# Patient Record
Sex: Female | Born: 1995 | Race: White | Hispanic: No | Marital: Married | Smoking: Never smoker
Health system: Southern US, Community
[De-identification: ages and names within clinical notes are randomized; demographics above are authoritative.]

## PROBLEM LIST (undated history)

## (undated) DIAGNOSIS — K219 Gastro-esophageal reflux disease without esophagitis: Secondary | ICD-10-CM

## (undated) DIAGNOSIS — Z87442 Personal history of urinary calculi: Secondary | ICD-10-CM

## (undated) DIAGNOSIS — J189 Pneumonia, unspecified organism: Secondary | ICD-10-CM

## (undated) DIAGNOSIS — D649 Anemia, unspecified: Secondary | ICD-10-CM

## (undated) DIAGNOSIS — N83209 Unspecified ovarian cyst, unspecified side: Secondary | ICD-10-CM

## (undated) DIAGNOSIS — J45909 Unspecified asthma, uncomplicated: Secondary | ICD-10-CM

## (undated) DIAGNOSIS — T7840XA Allergy, unspecified, initial encounter: Secondary | ICD-10-CM

## (undated) DIAGNOSIS — N809 Endometriosis, unspecified: Secondary | ICD-10-CM

## (undated) DIAGNOSIS — F32A Depression, unspecified: Secondary | ICD-10-CM

## (undated) DIAGNOSIS — Z9889 Other specified postprocedural states: Secondary | ICD-10-CM

## (undated) DIAGNOSIS — R519 Headache, unspecified: Secondary | ICD-10-CM

## (undated) DIAGNOSIS — F419 Anxiety disorder, unspecified: Secondary | ICD-10-CM

## (undated) DIAGNOSIS — F4310 Post-traumatic stress disorder, unspecified: Secondary | ICD-10-CM

## (undated) HISTORY — DX: Allergy, unspecified, initial encounter: T78.40XA

## (undated) HISTORY — PX: NASAL SEPTUM SURGERY: SHX37

## (undated) HISTORY — DX: Unspecified asthma, uncomplicated: J45.909

## (undated) HISTORY — DX: Depression, unspecified: F32.A

## (undated) HISTORY — DX: Anxiety disorder, unspecified: F41.9

## (undated) HISTORY — PX: WISDOM TOOTH EXTRACTION: SHX21

## (undated) HISTORY — DX: Gastro-esophageal reflux disease without esophagitis: K21.9

---
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
## 2013-01-01 ENCOUNTER — Emergency Department (HOSPITAL_COMMUNITY)
Admission: EM | Admit: 2013-01-01 | Discharge: 2013-01-02 | Disposition: A | Discharge status: Home / Self Care | Payer: Federal, State, Local not specified - PPO | Attending: Emergency Medicine | Admitting: Emergency Medicine

## 2013-01-01 DIAGNOSIS — R109 Unspecified abdominal pain: Secondary | ICD-10-CM | POA: Insufficient documentation

## 2013-01-01 DIAGNOSIS — Z3202 Encounter for pregnancy test, result negative: Secondary | ICD-10-CM | POA: Insufficient documentation

## 2013-01-01 DIAGNOSIS — Z79899 Other long term (current) drug therapy: Secondary | ICD-10-CM | POA: Insufficient documentation

## 2013-01-01 DIAGNOSIS — R300 Dysuria: Secondary | ICD-10-CM | POA: Insufficient documentation

## 2013-01-01 DIAGNOSIS — R112 Nausea with vomiting, unspecified: Secondary | ICD-10-CM | POA: Insufficient documentation

## 2013-01-01 DIAGNOSIS — N12 Tubulo-interstitial nephritis, not specified as acute or chronic: Secondary | ICD-10-CM | POA: Insufficient documentation

## 2013-01-01 DIAGNOSIS — R6883 Chills (without fever): Secondary | ICD-10-CM | POA: Insufficient documentation

## 2013-01-02 ENCOUNTER — Emergency Department (HOSPITAL_COMMUNITY): Payer: Federal, State, Local not specified - PPO

## 2013-01-02 ENCOUNTER — Encounter (HOSPITAL_COMMUNITY): Payer: Self-pay | Admitting: *Deleted

## 2013-01-02 LAB — PREGNANCY, URINE: Preg Test, Ur: NEGATIVE

## 2013-01-02 LAB — URINALYSIS, ROUTINE W REFLEX MICROSCOPIC
Glucose, UA: NEGATIVE mg/dL
Ketones, ur: NEGATIVE mg/dL
Nitrite: POSITIVE — AB
pH: 6 (ref 5.0–8.0)

## 2013-01-02 LAB — URINE MICROSCOPIC-ADD ON

## 2013-01-02 MED ORDER — ONDANSETRON 4 MG PO TBDP
4.0000 mg | ORAL_TABLET | Freq: Once | ORAL | Status: AC
  Administered 2013-01-02: 4 mg via ORAL
  Filled 2013-01-02: qty 1

## 2013-01-02 MED ORDER — HYDROCODONE-ACETAMINOPHEN 5-325 MG PO TABS: 1.0000 | ORAL_TABLET | ORAL | Status: DC | PRN

## 2013-01-02 MED ORDER — OXYCODONE-ACETAMINOPHEN 5-325 MG PO TABS
2.0000 | ORAL_TABLET | Freq: Once | ORAL | Status: AC
  Administered 2013-01-02: 2 via ORAL
  Filled 2013-01-02: qty 2

## 2013-01-02 MED ORDER — ONDANSETRON 8 MG PO TBDP: ORAL_TABLET | ORAL | Status: DC

## 2013-01-02 MED ORDER — SULFAMETHOXAZOLE-TRIMETHOPRIM 800-160 MG PO TABS: 1.0000 | ORAL_TABLET | Freq: Two times a day (BID) | ORAL | Status: DC

## 2013-01-02 MED ORDER — CEFTRIAXONE SODIUM 1 G IJ SOLR
1.0000 g | Freq: Once | INTRAMUSCULAR | Status: AC
  Administered 2013-01-02: 1 g via INTRAMUSCULAR
  Filled 2013-01-02: qty 10

## 2013-01-02 NOTE — ED Provider Notes (Signed)
History     CSN: [id number]  Arrival date & time 01/01/13  2353   First MD Initiated Contact with Patient 01/02/13 0209      Chief Complaint  Patient presents with  . Urinary Frequency   HPI  History provided by the patient. Patient is a 17 year old female with no significant PMH who presents with complaints of dysuria and right flank pain for the past 4-5 days. Symptoms have been progressively worsening. They have been associated with some subjective chills, decreased appetite and occasional nausea and vomiting symptoms. Patient has still been able to tolerate some fluids. Pain has been becoming much worse and intolerable at home. She has been taking Aleve with only minimal improvements. She denies having any associated hematuria or urinary frequency. Denies any menstrual change. No vaginal bleeding or vaginal discharge. Denies any abdominal pain. No diarrhea or constipation. No other aggravating or alleviating factors. No other associated symptoms.    History reviewed. No pertinent past medical history.  History reviewed. No pertinent past surgical history.  No family history on file.  History  Substance Use Topics  . Smoking status: Never Smoker   . Smokeless tobacco: Not on file  . Alcohol Use: No    OB History   Grav Para Term Preterm Abortions TAB SAB Ect Mult Living                  Review of Systems  Constitutional: Positive for chills and appetite change.  Respiratory: Negative for cough.   Gastrointestinal: Positive for nausea and vomiting. Negative for diarrhea and constipation.  Genitourinary: Positive for dysuria and flank pain. Negative for frequency, hematuria, vaginal bleeding and vaginal discharge.  All other systems reviewed and are negative.    Allergies  Codeine and Nsaids  Home Medications   Current Outpatient Rx  Name  Route  Sig  Dispense  Refill  . albuterol (PROVENTIL HFA;VENTOLIN HFA) 108 (90 BASE) MCG/ACT inhaler   Inhalation   Inhale  2 puffs into the lungs every 6 (six) hours as needed for wheezing or shortness of breath.         . cholecalciferol (VITAMIN D) 1000 UNITS tablet   Oral   Take 1,000 Units by mouth daily.         Marland Kitchen loratadine (CLARITIN) 10 MG tablet   Oral   Take 10 mg by mouth daily.         . Multiple Vitamin (MULTIVITAMIN WITH MINERALS) TABS   Oral   Take 1 tablet by mouth daily.         Marland Kitchen omega-3 acid ethyl esters (LOVAZA) 1 G capsule   Oral   Take 1 g by mouth daily.           BP 137/93  Pulse 92  Temp(Src) 98.5 F (36.9 C) (Oral)  Resp 18  Ht 5\' 7"  (1.702 m)  Wt 200 lb (90.719 kg)  BMI 31.32 kg/m2  SpO2 98%  LMP 12/19/2012  Physical Exam  Nursing note and vitals reviewed. Constitutional: She is oriented to person, place, and time. She appears well-developed and well-nourished. No distress.  HENT:  Head: Normocephalic.  Cardiovascular: Normal rate and regular rhythm.   Pulmonary/Chest: Effort normal and breath sounds normal.  Abdominal: Soft. She exhibits no distension. There is no tenderness. There is no rebound and no guarding.  Right-sided CVA tenderness  Musculoskeletal: Normal range of motion.  Neurological: She is alert and oriented to person, place, and time.  Skin: Skin is warm  and dry. No rash noted.  Psychiatric: She has a normal mood and affect. Her behavior is normal.    ED Course  Procedures  Results for orders placed during the hospital encounter of 01/01/13  URINALYSIS, ROUTINE W REFLEX MICROSCOPIC      Result Value Range   Color, Urine AMBER (*) YELLOW   APPearance TURBID (*) CLEAR   Specific Gravity, Urine 1.015  1.005 - 1.030   pH 6.0  5.0 - 8.0   Glucose, UA NEGATIVE  NEGATIVE mg/dL   Hgb urine dipstick LARGE (*) NEGATIVE   Bilirubin Urine NEGATIVE  NEGATIVE   Ketones, ur NEGATIVE  NEGATIVE mg/dL   Protein, ur 454 (*) NEGATIVE mg/dL   Urobilinogen, UA 0.2  0.0 - 1.0 mg/dL   Nitrite POSITIVE (*) NEGATIVE   Leukocytes, UA LARGE (*)  NEGATIVE  PREGNANCY, URINE      Result Value Range   Preg Test, Ur NEGATIVE  NEGATIVE  URINE MICROSCOPIC-ADD ON      Result Value Range   WBC, UA TOO NUMEROUS TO COUNT  <3 WBC/hpf   RBC / HPF TOO NUMEROUS TO COUNT  <3 RBC/hpf   Urine-Other FIELD OBSCURED BY WBC'S           Ct Abdomen Pelvis Wo Contrast  01/02/2013  *RADIOLOGY REPORT*  Clinical Data: Low back pain.  Urinary frequency.  CT ABDOMEN AND PELVIS WITHOUT CONTRAST  Technique:  Multidetector CT imaging of the abdomen and pelvis was performed following the standard protocol without intravenous contrast.  Comparison: None.  Findings: Lung Bases: Negative.  Liver:  Unenhanced CT was performed per clinician order.  Lack of IV contrast limits sensitivity and specificity, especially for evaluation of abdominal/pelvic solid viscera.  Liver grossly normal.  Spleen:  Normal.  Gallbladder:  No calcified stones.  Normal.  Common bile duct:  Normal.  Pancreas:  Normal.  Adrenal glands:  Normal.  Kidneys:  No collecting system calculi in the kidneys. Mild left ureteral ectasia.  There is mild perinephric stranding around the right kidney.  No calculi are present.  Periureteric stranding is present around the right ureter.  There is no ureteral calculus identified.  Findings suggestive of ascending urinary tract and fraction was with the right perinephric stranding likely due to pyelonephritis.  Stomach:  Grossly normal.  Small bowel:  Grossly normal.  Colon:   Grossly normal.  Pelvic Genitourinary:  Physiologic appearance of the uterus and adnexa.  Urinary bladder normal.  Bones:  Normal.  Vasculature: Normal for noncontrast technique.  IMPRESSION: Right perinephric and periureteric stranding suggesting ascending urinary tract infection and pyelonephritis.  No renal calculi. Recent passage of stone could simulate these findings.   Original Report Authenticated By: Andreas Newport, M.D.      1. Pyelonephritis       MDM  Patient seen and evaluated.  Patient appears well in no acute distress. She is not appears fairly ill or toxic. Unremarkable bowel signs. Patient tolerating by mouth fluids.  Patient feeling better after pain medications. She continues to be able tolerate by mouth fluids well. CT scan is not show signs of kidney stone at this time. Urine and CT of consistent with pyelonephritis. We'll give dose of Rocephin now as well as prescription for Bactrim.       Angus Seller, PA-C 01/02/13 0430

## 2013-01-02 NOTE — ED Notes (Signed)
Pt c/o urinary frequency x 2 days; pink tinged urine; lower back pain

## 2013-01-02 NOTE — ED Provider Notes (Signed)
Medical screening examination/treatment/procedure(s) were performed by non-physician practitioner and as supervising physician I was immediately available for consultation/collaboration.  Sunnie Nielsen, MD 01/02/13 [phone]

## 2013-01-04 LAB — URINE CULTURE

## 2013-01-05 ENCOUNTER — Telehealth (HOSPITAL_COMMUNITY): Payer: Self-pay | Admitting: Emergency Medicine

## 2013-01-05 NOTE — ED Notes (Signed)
+  Urine. Patient treated with Septra. Sensitive to same. Per protocol MD. °

## 2013-01-05 NOTE — ED Notes (Signed)
Patient has +Urine culture. Checking to see if treated appropriately. °

## 2014-01-12 ENCOUNTER — Emergency Department (HOSPITAL_COMMUNITY)
Admission: EM | Admit: 2014-01-12 | Discharge: 2014-01-12 | Disposition: A | Discharge status: Home / Self Care | Payer: Federal, State, Local not specified - PPO | Attending: Emergency Medicine | Admitting: Emergency Medicine

## 2014-01-12 ENCOUNTER — Encounter (HOSPITAL_COMMUNITY): Payer: Self-pay | Admitting: Emergency Medicine

## 2014-01-12 DIAGNOSIS — S298XXA Other specified injuries of thorax, initial encounter: Secondary | ICD-10-CM | POA: Insufficient documentation

## 2014-01-12 DIAGNOSIS — M542 Cervicalgia: Secondary | ICD-10-CM

## 2014-01-12 DIAGNOSIS — S0993XA Unspecified injury of face, initial encounter: Secondary | ICD-10-CM | POA: Insufficient documentation

## 2014-01-12 DIAGNOSIS — R0602 Shortness of breath: Secondary | ICD-10-CM | POA: Insufficient documentation

## 2014-01-12 DIAGNOSIS — S199XXA Unspecified injury of neck, initial encounter: Principal | ICD-10-CM

## 2014-01-12 DIAGNOSIS — S0990XA Unspecified injury of head, initial encounter: Secondary | ICD-10-CM | POA: Insufficient documentation

## 2014-01-12 DIAGNOSIS — Z79899 Other long term (current) drug therapy: Secondary | ICD-10-CM | POA: Insufficient documentation

## 2014-01-12 DIAGNOSIS — Z792 Long term (current) use of antibiotics: Secondary | ICD-10-CM | POA: Insufficient documentation

## 2014-01-12 DIAGNOSIS — Y9389 Activity, other specified: Secondary | ICD-10-CM | POA: Insufficient documentation

## 2014-01-12 DIAGNOSIS — Y92410 Unspecified street and highway as the place of occurrence of the external cause: Secondary | ICD-10-CM | POA: Insufficient documentation

## 2014-01-12 DIAGNOSIS — G44209 Tension-type headache, unspecified, not intractable: Secondary | ICD-10-CM | POA: Insufficient documentation

## 2014-01-12 MED ORDER — CYCLOBENZAPRINE HCL 10 MG PO TABS: 10.0000 mg | ORAL_TABLET | Freq: Two times a day (BID) | ORAL | Status: DC | PRN

## 2014-01-12 MED ORDER — HYDROCODONE-ACETAMINOPHEN 5-325 MG PO TABS: 1.0000 | ORAL_TABLET | ORAL | Status: DC | PRN
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
## 2014-01-12 NOTE — ED Notes (Signed)
Discharge instructions given to patient. Voiced understanding
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
## 2014-01-13 NOTE — ED Provider Notes (Signed)
Medical screening examination/treatment/procedure(s) were performed by non-physician practitioner and as supervising physician I was immediately available for consultation/collaboration.   Megan E Docherty, MD 01/13/14 1053 

## 2014-05-16 ENCOUNTER — Encounter: Payer: Self-pay | Admitting: Gynecology

## 2014-05-16 ENCOUNTER — Ambulatory Visit (INDEPENDENT_AMBULATORY_CARE_PROVIDER_SITE_OTHER): Payer: Federal, State, Local not specified - PPO | Admitting: Gynecology

## 2014-05-16 VITALS — BP 134/86 | Ht 65.0 in | Wt 267.4 lb

## 2014-05-16 DIAGNOSIS — Z113 Encounter for screening for infections with a predominantly sexual mode of transmission: Secondary | ICD-10-CM

## 2014-05-16 DIAGNOSIS — F4310 Post-traumatic stress disorder, unspecified: Secondary | ICD-10-CM | POA: Insufficient documentation

## 2014-05-16 DIAGNOSIS — Z01419 Encounter for gynecological examination (general) (routine) without abnormal findings: Secondary | ICD-10-CM

## 2014-05-16 LAB — CBC WITH DIFFERENTIAL/PLATELET
BASOS ABS: 0 10*3/uL (ref 0.0–0.1)
Basophils Relative: 0 % (ref 0–1)
EOS PCT: 4 % (ref 0–5)
Eosinophils Absolute: 0.3 10*3/uL (ref 0.0–0.7)
HEMATOCRIT: 40.6 % (ref 36.0–46.0)
HEMOGLOBIN: 13.8 g/dL (ref 12.0–15.0)
LYMPHS ABS: 2.5 10*3/uL (ref 0.7–4.0)
LYMPHS PCT: 32 % (ref 12–46)
MCH: 29.2 pg (ref 26.0–34.0)
MCHC: 34 g/dL (ref 30.0–36.0)
MCV: 85.8 fL (ref 78.0–100.0)
MONO ABS: 0.7 10*3/uL (ref 0.1–1.0)
MONOS PCT: 9 % (ref 3–12)
Neutro Abs: 4.3 10*3/uL (ref 1.7–7.7)
Neutrophils Relative %: 55 % (ref 43–77)
Platelets: 269 10*3/uL (ref 150–400)
RBC: 4.73 MIL/uL (ref 3.87–5.11)
RDW: 13.8 % (ref 11.5–15.5)
WBC: 7.8 10*3/uL (ref 4.0–10.5)

## 2014-05-16 NOTE — Patient Instructions (Signed)
Levonorgestrel intrauterine device (IUD) What is this medicine? LEVONORGESTREL IUD (LEE voe nor jes trel) is a contraceptive (birth control) device. The device is placed inside the uterus by a healthcare professional. It is used to prevent pregnancy and can also be used to treat heavy bleeding that occurs during your period. Depending on the device, it can be used for 3 to 5 years. This medicine may be used for other purposes; ask your health care provider or pharmacist if you have questions. COMMON BRAND NAME(S): LILETTA, Mirena, Skyla What should I tell my health care provider before I take this medicine? They need to know if you have any of these conditions: -abnormal Pap smear -cancer of the breast, uterus, or cervix -diabetes -endometritis -genital or pelvic infection now or in the past -have more than one sexual partner or your partner has more than one partner -heart disease -history of an ectopic or tubal pregnancy -immune system problems -IUD in place -liver disease or tumor -problems with blood clots or take blood-thinners -use intravenous drugs -uterus of unusual shape -vaginal bleeding that has not been explained -an unusual or allergic reaction to levonorgestrel, other hormones, silicone, or polyethylene, medicines, foods, dyes, or preservatives -pregnant or trying to get pregnant -breast-feeding How should I use this medicine? This device is placed inside the uterus by a health care professional. Talk to your pediatrician regarding the use of this medicine in children. Special care may be needed. Overdosage: If you think you have taken too much of this medicine contact a poison control center or emergency room at once. NOTE: This medicine is only for you. Do not share this medicine with others. What if I miss a dose? This does not apply. What may interact with this medicine? Do not take this medicine with any of the following  medications: -amprenavir -bosentan -fosamprenavir This medicine may also interact with the following medications: -aprepitant -barbiturate medicines for inducing sleep or treating seizures -bexarotene -griseofulvin -medicines to treat seizures like carbamazepine, ethotoin, felbamate, oxcarbazepine, phenytoin, topiramate -modafinil -pioglitazone -rifabutin -rifampin -rifapentine -some medicines to treat HIV infection like atazanavir, indinavir, lopinavir, nelfinavir, tipranavir, ritonavir -St. John's wort -warfarin This list may not describe all possible interactions. Give your health care provider a list of all the medicines, herbs, non-prescription drugs, or dietary supplements you use. Also tell them if you smoke, drink alcohol, or use illegal drugs. Some items may interact with your medicine. What should I watch for while using this medicine? Visit your doctor or health care professional for regular check ups. See your doctor if you or your partner has sexual contact with others, becomes HIV positive, or gets a sexual transmitted disease. This product does not protect you against HIV infection (AIDS) or other sexually transmitted diseases. You can check the placement of the IUD yourself by reaching up to the top of your vagina with clean fingers to feel the threads. Do not pull on the threads. It is a good habit to check placement after each menstrual period. Call your doctor right away if you feel more of the IUD than just the threads or if you cannot feel the threads at all. The IUD may come out by itself. You may become pregnant if the device comes out. If you notice that the IUD has come out use a backup birth control method like condoms and call your health care provider. Using tampons will not change the position of the IUD and are okay to use during your period. What side effects may   I notice from receiving this medicine? Side effects that you should report to your doctor or  health care professional as soon as possible: -allergic reactions like skin rash, itching or hives, swelling of the face, lips, or tongue -fever, flu-like symptoms -genital sores -high blood pressure -no menstrual period for 6 weeks during use -pain, swelling, warmth in the leg -pelvic pain or tenderness -severe or sudden headache -signs of pregnancy -stomach cramping -sudden shortness of breath -trouble with balance, talking, or walking -unusual vaginal bleeding, discharge -yellowing of the eyes or skin Side effects that usually do not require medical attention (report to your doctor or health care professional if they continue or are bothersome): -acne -breast pain -change in sex drive or performance -changes in weight -cramping, dizziness, or faintness while the device is being inserted -headache -irregular menstrual bleeding within first 3 to 6 months of use -nausea This list may not describe all possible side effects. Call your doctor for medical advice about side effects. You may report side effects to FDA at 1-800-FDA-1088. Where should I keep my medicine? This does not apply. NOTE: This sheet is a summary. It may not cover all possible information. If you have questions about this medicine, talk to your doctor, pharmacist, or health care provider.  2015, Elsevier/Gold Standard. (2011-10-28 13:54:04)  
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
## 2014-05-17 LAB — COMPREHENSIVE METABOLIC PANEL
ALBUMIN: 4.4 g/dL (ref 3.5–5.2)
ALT: 17 U/L (ref 0–35)
AST: 18 U/L (ref 0–37)
Alkaline Phosphatase: 65 U/L (ref 39–117)
BUN: 15 mg/dL (ref 6–23)
CALCIUM: 9.7 mg/dL (ref 8.4–10.5)
CHLORIDE: 103 meq/L (ref 96–112)
CO2: 25 meq/L (ref 19–32)
CREATININE: 0.85 mg/dL (ref 0.50–1.10)
GLUCOSE: 81 mg/dL (ref 70–99)
Potassium: 4.1 mEq/L (ref 3.5–5.3)
Sodium: 138 mEq/L (ref 135–145)
Total Bilirubin: 0.3 mg/dL (ref 0.2–1.2)
Total Protein: 7.1 g/dL (ref 6.0–8.3)

## 2014-05-17 LAB — URINALYSIS W MICROSCOPIC + REFLEX CULTURE
Bacteria, UA: NONE SEEN
Bilirubin Urine: NEGATIVE
Casts: NONE SEEN
Crystals: NONE SEEN
Glucose, UA: NEGATIVE mg/dL
HGB URINE DIPSTICK: NEGATIVE
Ketones, ur: NEGATIVE mg/dL
LEUKOCYTES UA: NEGATIVE
Nitrite: NEGATIVE
PROTEIN: NEGATIVE mg/dL
Specific Gravity, Urine: 1.018 (ref 1.005–1.030)
UROBILINOGEN UA: 0.2 mg/dL (ref 0.0–1.0)
pH: 5 (ref 5.0–8.0)

## 2014-05-17 LAB — TSH: TSH: 2.632 u[IU]/mL (ref 0.350–4.500)

## 2014-05-17 LAB — GC/CHLAMYDIA PROBE AMP
CT PROBE, AMP APTIMA: NEGATIVE
GC PROBE AMP APTIMA: NEGATIVE

## 2014-05-17 LAB — CHOLESTEROL, TOTAL: CHOLESTEROL: 158 mg/dL (ref 0–200)

## 2014-05-20 ENCOUNTER — Other Ambulatory Visit: Payer: Self-pay | Admitting: Gynecology

## 2014-05-20 ENCOUNTER — Telehealth: Payer: Self-pay | Admitting: Gynecology

## 2014-05-20 DIAGNOSIS — Z3049 Encounter for surveillance of other contraceptives: Secondary | ICD-10-CM

## 2014-05-20 MED ORDER — LEVONORGESTREL 20 MCG/24HR IU IUD: INTRAUTERINE_SYSTEM | Freq: Once | INTRAUTERINE | Status: DC

## 2014-05-20 NOTE — Telephone Encounter (Signed)
05/20/14-Pt was advised today that the Mirena & insertion will be covered at 100% [facility] She will call first day next cycle for Insertion with JF/wl

## 2014-11-19 ENCOUNTER — Telehealth: Payer: Self-pay | Admitting: Gynecology

## 2014-11-19 ENCOUNTER — Other Ambulatory Visit: Payer: Self-pay | Admitting: Gynecology

## 2014-11-19 DIAGNOSIS — Z30431 Encounter for routine checking of intrauterine contraceptive device: Secondary | ICD-10-CM

## 2014-11-19 MED ORDER — LEVONORGESTREL 20 MCG/24HR IU IUD: INTRAUTERINE_SYSTEM | Freq: Once | INTRAUTERINE | Status: DC

## 2014-11-19 NOTE — Telephone Encounter (Signed)
11/19/14-I rechecked pt Mirena benefits as she did not get inserted in 2015 and [facility] it is still covered at 100%, no copay, for contraception. She will call to schedule with JF. This is a calendar year benefit.wl--BC [phone]#[id number]

## 2014-11-21 ENCOUNTER — Ambulatory Visit (INDEPENDENT_AMBULATORY_CARE_PROVIDER_SITE_OTHER): Payer: Federal, State, Local not specified - PPO | Admitting: Gynecology

## 2014-11-21 ENCOUNTER — Encounter: Payer: Self-pay | Admitting: Gynecology

## 2014-11-21 VITALS — BP 128/86

## 2014-11-21 DIAGNOSIS — Z975 Presence of (intrauterine) contraceptive device: Secondary | ICD-10-CM | POA: Insufficient documentation

## 2014-11-21 DIAGNOSIS — Z30430 Encounter for insertion of intrauterine contraceptive device: Secondary | ICD-10-CM

## 2014-11-21 NOTE — Patient Instructions (Signed)

## 2014-11-21 NOTE — Progress Notes (Signed)
   Patient is a 19 year old who presented to the office today for placement of Mirena IUD. Patient had been on the Depo-Provera and her last dose was in September 2015. Patient has had normal menstrual cycles currently menstruating.                                                                    IUD procedure note       Patient presented to the office today for placement of Mirena IUD. The patient had previously been provided with literature information on this method of contraception. The risks benefits and pros and cons were discussed and all her questions were answered. She is fully aware that this form of contraception is 99% effective and is good for 5 years.  Pelvic exam: Bartholin urethra Skene glands: Within normal limits Vagina: No lesions or discharge Cervix: No lesions or discharge Uterus: Anteverted position Adnexa: No masses or tenderness Rectal exam: Not done  The cervix was cleansed with Betadine solution. A single-tooth tenaculum was placed on the anterior cervical lip. The uterus sounded to 7-1/2 centimeter. The IUD was shown to the patient and inserted in a sterile fashion. The IUD string was trimmed. The single-tooth tenaculum was removed. Patient was instructed to return back to the office in one month for follow up.       Mirena IUD placed 11/21/2014 good for 5 years. Lot number [id number]

## 2014-11-22 ENCOUNTER — Encounter: Payer: Self-pay | Admitting: Gynecology

## 2014-12-19 ENCOUNTER — Encounter: Payer: Self-pay | Admitting: Gynecology

## 2014-12-19 ENCOUNTER — Ambulatory Visit (INDEPENDENT_AMBULATORY_CARE_PROVIDER_SITE_OTHER): Payer: Federal, State, Local not specified - PPO | Admitting: Gynecology

## 2014-12-19 ENCOUNTER — Ambulatory Visit (INDEPENDENT_AMBULATORY_CARE_PROVIDER_SITE_OTHER): Payer: Federal, State, Local not specified - PPO

## 2014-12-19 ENCOUNTER — Other Ambulatory Visit: Payer: Self-pay | Admitting: Gynecology

## 2014-12-19 VITALS — BP 120/76

## 2014-12-19 DIAGNOSIS — R102 Pelvic and perineal pain: Secondary | ICD-10-CM

## 2014-12-19 DIAGNOSIS — Z30431 Encounter for routine checking of intrauterine contraceptive device: Secondary | ICD-10-CM

## 2014-12-19 DIAGNOSIS — N8320 Unspecified ovarian cysts: Secondary | ICD-10-CM | POA: Diagnosis not present

## 2014-12-19 DIAGNOSIS — N921 Excessive and frequent menstruation with irregular cycle: Secondary | ICD-10-CM

## 2014-12-19 DIAGNOSIS — N857 Hematometra: Secondary | ICD-10-CM | POA: Diagnosis not present

## 2014-12-19 DIAGNOSIS — Z975 Presence of (intrauterine) contraceptive device: Secondary | ICD-10-CM

## 2014-12-19 DIAGNOSIS — N83201 Unspecified ovarian cyst, right side: Secondary | ICD-10-CM

## 2014-12-19 LAB — PREGNANCY, URINE: PREG TEST UR: NEGATIVE

## 2014-12-19 MED ORDER — DOXYCYCLINE HYCLATE 100 MG PO CAPS: 100.0000 mg | ORAL_CAPSULE | Freq: Two times a day (BID) | ORAL | Status: DC

## 2014-12-19 NOTE — Progress Notes (Signed)
   Patient is a 19 year old who presented to the office today for her 1 month follow-up after having placed the Mirena IUD. She was seen for her annual exam back in August 6 and she had been on Depo-Provera but had to discontinue because of irregular bleeding. We'll place a Mirena IUD she had been off the Depo-Provera for 2 months. Patient denies any GU or GI complaints. Patient denies any back pain. Patient denies any fever, chills, nausea, or vomiting. She stated she spotted the first few months after the Mirena IUD was inserted and for the past 2 weeks she has been bleeding along with right lower abdominal discomfort.  Exam: Blood pressure 120/76 weight 267 pounds BMI 44.50 kg/m   55 feet 5 inches tall   Gen. appearanc: Well-developed well-nourished overweight Caucasian female with complaint of right lower abdominal discomfort and irregular bleeding with Mirena IUD placed one month ago. Back: No CVA tenderness Abdomen: Pendulous, soft nontender no rebound or guarding Pelvic: Bartholin urethra Skene was within normal limits Vagina: No blood was present the vaginal vault Cervix: No active bleeding no lesions seen IUD string not visualized Bimanual exam anteverted uterus some tenderness elicited the right adnexa but due to patient's abdominal girth it was difficult to assess and for this reason an ultrasound will be ordered later today as well as to confirm position of the IUD.  Ultrasound: Uterus measures 7.9 x 4.8 x 4.0 cm with endometrial stripe of 15.7 mm. The IUD was seen in the proper position. Endometrium was prominent with a hypoechoic avascular focus measuring 20 x 11 mm. Right ovary thinwall cyst with a thin septum measuring 16 x 19 mm echogenic focus 3 mm was noted avascular. Left R was normal no fluid in the cul-de-sac.     Assessment/plan: Patient one month status post placement of Mirena IUD with dysfunctional uterine bleeding patient was similar situation when she was the  Depo-Provera injection. She is going to be prescribed Vibramycin 100 mg twice a day for 7 days in the event a mild endometritis. She will return back to the office in 3 months for follow-up ultrasound for the small ovarian cysts.

## 2014-12-19 NOTE — Patient Instructions (Signed)
Ovarian Cyst An ovarian cyst is a fluid-filled sac that forms on an ovary. The ovaries are small organs that produce eggs in women. Various types of cysts can form on the ovaries. Most are not cancerous. Many do not cause problems, and they often go away on their own. Some may cause symptoms and require treatment. Common types of ovarian cysts include:  Functional cysts--These cysts may occur every month during the menstrual cycle. This is normal. The cysts usually go away with the next menstrual cycle if the woman does not get pregnant. Usually, there are no symptoms with a functional cyst.  Endometrioma cysts--These cysts form from the tissue that lines the uterus. They are also called "chocolate cysts" because they become filled with blood that turns brown. This type of cyst can cause pain in the lower abdomen during intercourse and with your menstrual period.  Cystadenoma cysts--This type develops from the cells on the outside of the ovary. These cysts can get very big and cause lower abdomen pain and pain with intercourse. This type of cyst can twist on itself, cut off its blood supply, and cause severe pain. It can also easily rupture and cause a lot of pain.  Dermoid cysts--This type of cyst is sometimes found in both ovaries. These cysts may contain different kinds of body tissue, such as skin, teeth, hair, or cartilage. They usually do not cause symptoms unless they get very big.  Theca lutein cysts--These cysts occur when too much of a certain hormone (human chorionic gonadotropin) is produced and overstimulates the ovaries to produce an egg. This is most common after procedures used to assist with the conception of a baby (in vitro fertilization). CAUSES   Fertility drugs can cause a condition in which multiple large cysts are formed on the ovaries. This is called ovarian hyperstimulation syndrome.  A condition called polycystic ovary syndrome can cause hormonal imbalances that can lead to  nonfunctional ovarian cysts. SIGNS AND SYMPTOMS  Many ovarian cysts do not cause symptoms. If symptoms are present, they may include:  Pelvic pain or pressure.  Pain in the lower abdomen.  Pain during sexual intercourse.  Increasing girth (swelling) of the abdomen.  Abnormal menstrual periods.  Increasing pain with menstrual periods.  Stopping having menstrual periods without being pregnant. DIAGNOSIS  These cysts are commonly found during a routine or annual pelvic exam. Tests may be ordered to find out more about the cyst. These tests may include:  Ultrasound.  X-ray of the pelvis.  CT scan.  MRI.  Blood tests. TREATMENT  Many ovarian cysts go away on their own without treatment. Your health care provider may want to check your cyst regularly for 2-3 months to see if it changes. For women in menopause, it is particularly important to monitor a cyst closely because of the higher rate of ovarian cancer in menopausal women. When treatment is needed, it may include any of the following:  A procedure to drain the cyst (aspiration). This may be done using a long needle and ultrasound. It can also be done through a laparoscopic procedure. This involves using a thin, lighted tube with a tiny camera on the end (laparoscope) inserted through a small incision.  Surgery to remove the whole cyst. This may be done using laparoscopic surgery or an open surgery involving a larger incision in the lower abdomen.  Hormone treatment or birth control pills. These methods are sometimes used to help dissolve a cyst. HOME CARE INSTRUCTIONS   Only take over-the-counter   or prescription medicines as directed by your health care provider.  Follow up with your health care provider as directed.  Get regular pelvic exams and Pap tests. SEEK MEDICAL CARE IF:   Your periods are late, irregular, or painful, or they stop.  Your pelvic pain or abdominal pain does not go away.  Your abdomen becomes  larger or swollen.  You have pressure on your bladder or trouble emptying your bladder completely.  You have pain during sexual intercourse.  You have feelings of fullness, pressure, or discomfort in your stomach.  You lose weight for no apparent reason.  You feel generally ill.  You become constipated.  You lose your appetite.  You develop acne.  You have an increase in body and facial hair.  You are gaining weight, without changing your exercise and eating habits.  You think you are pregnant. SEEK IMMEDIATE MEDICAL CARE IF:   You have increasing abdominal pain.  You feel sick to your stomach (nauseous), and you throw up (vomit).  You develop a fever that comes on suddenly.  You have abdominal pain during a bowel movement.  Your menstrual periods become heavier than usual. MAKE SURE YOU:  Understand these instructions.  Will watch your condition.  Will get help right away if you are not doing well or get worse. Document Released: 09/27/2005 Document Revised: 10/02/2013 Document Reviewed: 06/04/2013 ExitCare Patient Information 2015 [employer]. This information is not intended to replace advice given to you by your health care provider. Make sure you discuss any questions you have with your health care provider.  

## 2015-01-20 LAB — HM HIV SCREENING LAB: HM HIV Screening: NEGATIVE

## 2015-01-20 LAB — HM HEPATITIS C SCREENING LAB: HM Hepatitis Screen: NEGATIVE

## 2015-03-21 ENCOUNTER — Ambulatory Visit (INDEPENDENT_AMBULATORY_CARE_PROVIDER_SITE_OTHER): Payer: Federal, State, Local not specified - PPO | Admitting: Gynecology

## 2015-03-21 ENCOUNTER — Ambulatory Visit (INDEPENDENT_AMBULATORY_CARE_PROVIDER_SITE_OTHER): Payer: Federal, State, Local not specified - PPO

## 2015-03-21 ENCOUNTER — Other Ambulatory Visit: Payer: Self-pay | Admitting: Gynecology

## 2015-03-21 ENCOUNTER — Encounter: Payer: Self-pay | Admitting: Gynecology

## 2015-03-21 VITALS — BP 126/80 | Ht 65.0 in | Wt 267.0 lb

## 2015-03-21 DIAGNOSIS — T8332XA Displacement of intrauterine contraceptive device, initial encounter: Secondary | ICD-10-CM

## 2015-03-21 DIAGNOSIS — N921 Excessive and frequent menstruation with irregular cycle: Secondary | ICD-10-CM

## 2015-03-21 DIAGNOSIS — N8320 Unspecified ovarian cysts: Secondary | ICD-10-CM

## 2015-03-21 DIAGNOSIS — Z975 Presence of (intrauterine) contraceptive device: Secondary | ICD-10-CM

## 2015-03-21 DIAGNOSIS — N83202 Unspecified ovarian cyst, left side: Secondary | ICD-10-CM

## 2015-03-21 DIAGNOSIS — N83201 Unspecified ovarian cyst, right side: Secondary | ICD-10-CM

## 2015-03-21 DIAGNOSIS — T8389XA Other specified complication of genitourinary prosthetic devices, implants and grafts, initial encounter: Secondary | ICD-10-CM

## 2015-03-21 DIAGNOSIS — N938 Other specified abnormal uterine and vaginal bleeding: Secondary | ICD-10-CM | POA: Diagnosis not present

## 2015-03-21 MED ORDER — TRANEXAMIC ACID 650 MG PO TABS: 1300.0000 mg | ORAL_TABLET | Freq: Three times a day (TID) | ORAL | Status: DC

## 2015-03-21 NOTE — Addendum Note (Signed)
Addended by: Ok Edwards on: 03/21/2015 10:03 AM   Modules accepted: Orders

## 2015-03-21 NOTE — Progress Notes (Signed)
   Patient presented to the office today because of a persistent dysfunctional uterine bleeding after having had the Mirena IUD placed. Patient had been seen the office on February 2016 after having placed the Mirena IUD. Patient to passive been on Depo-Provera and had to discontinue because continuing irregular bleeding. An ultrasound done on that office visit on 12/19/2014 demonstrated the following:  Uterus measures 7.9 x 4.8 x 4.0 cm with endometrial stripe of 15.7 mm. The IUD was seen in the proper position. Endometrium was prominent with a hypoechoic avascular focus measuring 20 x 11 mm. Right ovary thinwall cyst with a thin septum measuring 16 x 19 mm echogenic focus 3 mm was noted avascular. Left R was normal no fluid in the cul-de-sac.  She was prescribed Vibramycin 100 mg twice a day for 7 days in the event that it was a possible endometritis but she has continued to bleed and is here for follow-up ultrasound.  Ultrasound today: Uterus measures 7.9 x 4.8 x 4.0 cm within a major stripe of 8.9 mm. IUD was seen in the lower uterine segment above the cervix. Small calcified area of left ovary measuring 3 mm was noted. A thinwall echo-free follicle measuring 23 x 20 x 22 mm avascular was noted on the left. The small cyst with small septum noted on the left ovary on previous scan not present. There was no fluid in the cul-de-sac.  Patient was presented with above findings it was recommended to remove the IUD to relieve her symptoms of cramping and irregular bleeding since the IUD was sitting in the lower uterine segment year the cervix. The cervix was cleansed with Betadine solution and a [place] clamp was used to grasp the IUD string and was retrieved shown to the patient and discarded.  Assessment/plan: Patient dysfunctional bleeding as a result of migration of the IUD to the lower uterine segment near the top of the internal cervical os was removed. Previously seen left ovarian cyst with septum  resolved. Patient will be prescribed Lysteda 650 mg 2 tablets 3 times a day for 5 days during her menses she'll use barrier contraception for the next 3 months. She will return back to the office in 3 months for placement of the Mirena IUD under ultrasound guidance. Meanwhile she will use barrier contraception.

## 2015-03-21 NOTE — Patient Instructions (Signed)
Tranexamic acid oral tablets What is this medicine? TRANEXAMIC ACID (TRAN ex AM ik AS id) slows down or stops blood clots from being broken down. This medicine is used to treat heavy monthly menstrual bleeding. This medicine may be used for other purposes; ask your health care provider or pharmacist if you have questions. COMMON BRAND NAME(S): Cyklokapron, Lysteda What should I tell my health care provider before I take this medicine? They need to know if you have any of these conditions: -bleeding in the brain -blood clotting problems -kidney disease -vision problems -an unusual allergic reaction to tranexamic acid, other medicines, foods, dyes, or preservatives -pregnant or trying to get pregnant -breast-feeding How should I use this medicine? Take this medicine by mouth with a glass of water. Follow the directions on the prescription label. Do not cut, crush, or chew this medicine. You can take it with or without food. If it upsets your stomach, take it with food. Take your medicine at regular intervals. Do not take it more often than directed. Do not stop taking except on your doctor's advice. Do not take this medicine until your period has started. Do not take it for more than 5 days in a row. Do not take this medicine when you do not have your period. Talk to your pediatrician regarding the use of this medicine in children. While this drug may be prescribed for female children as young as 12 years of age for selected conditions, precautions do apply. Overdosage: If you think you've taken too much of this medicine contact a poison control center or emergency room at once. Overdosage: If you think you have taken too much of this medicine contact a poison control center or emergency room at once. NOTE: This medicine is only for you. Do not share this medicine with others. What if I miss a dose? If you miss a dose, take it when you remember, and then take your next dose at least 6 hours  later. Do not take more than 2 tablets at a time to make up for missed doses. What may interact with this medicine? Do not take this medicine with any of the following medications: -female hormones, like estrogens or progestins and birth control pills, patches, rings, or injections This medicine may also interact with the following medications: -certain medicines used to help your blood clot or break up blood clots -certain medicines used to treat leukemia This list may not describe all possible interactions. Give your health care provider a list of all the medicines, herbs, non-prescription drugs, or dietary supplements you use. Also tell them if you smoke, drink alcohol, or use illegal drugs. Some items may interact with your medicine. What should I watch for while using this medicine? Tell your doctor or healthcare professional if your symptoms do not start to get better or if they get worse. Tell your doctor or healthcare professional if you notice any eye problems while taking this medicine. Your doctor will refer you to an eye doctor who will examine your eyes. What side effects may I notice from receiving this medicine? Side effects that you should report to your doctor or health care professional as soon as possible: -allergic reactions like skin rash, itching or hives, swelling of the face, lips, or tongue -breathing difficulties -changes in vision -sudden or severe pain in the chest, legs, head, or groin -unusually weak or tired Side effects that usually do not require medical attention (Report these to your doctor or health care professional if   they continue or are bothersome.): -back pain -headache -muscle or joint aches -sinus and nasal problems -stomach pain -tiredness This list may not describe all possible side effects. Call your doctor for medical advice about side effects. You may report side effects to FDA at 1-800-FDA-1088. Where should I keep my medicine? Keep out of  the reach of children. Store at room temperature between 15 and 30 degrees C (59 and 86 degrees F). Throw away any unused medicine after the expiration date. NOTE: This sheet is a summary. It may not cover all possible information. If you have questions about this medicine, talk to your doctor, pharmacist, or health care provider.  2015, [employer]. (2012-09-11 17:45:19)  

## 2015-07-16 ENCOUNTER — Other Ambulatory Visit: Payer: Federal, State, Local not specified - PPO

## 2015-07-16 ENCOUNTER — Ambulatory Visit: Payer: Federal, State, Local not specified - PPO | Admitting: Gynecology

## 2015-07-23 ENCOUNTER — Ambulatory Visit (INDEPENDENT_AMBULATORY_CARE_PROVIDER_SITE_OTHER): Payer: Federal, State, Local not specified - PPO

## 2015-07-23 ENCOUNTER — Ambulatory Visit (INDEPENDENT_AMBULATORY_CARE_PROVIDER_SITE_OTHER): Payer: Federal, State, Local not specified - PPO | Admitting: Gynecology

## 2015-07-23 ENCOUNTER — Encounter: Payer: Self-pay | Admitting: Gynecology

## 2015-07-23 ENCOUNTER — Other Ambulatory Visit: Payer: Self-pay | Admitting: Gynecology

## 2015-07-23 DIAGNOSIS — N83201 Unspecified ovarian cyst, right side: Secondary | ICD-10-CM

## 2015-07-23 DIAGNOSIS — T8332XA Displacement of intrauterine contraceptive device, initial encounter: Secondary | ICD-10-CM

## 2015-07-23 DIAGNOSIS — T8389XA Other specified complication of genitourinary prosthetic devices, implants and grafts, initial encounter: Principal | ICD-10-CM

## 2015-07-23 DIAGNOSIS — Z975 Presence of (intrauterine) contraceptive device: Secondary | ICD-10-CM | POA: Insufficient documentation

## 2015-07-23 DIAGNOSIS — N938 Other specified abnormal uterine and vaginal bleeding: Secondary | ICD-10-CM

## 2015-07-23 DIAGNOSIS — Z30430 Encounter for insertion of intrauterine contraceptive device: Secondary | ICD-10-CM

## 2015-07-23 NOTE — Progress Notes (Addendum)
Patient was last seen the office on June 10 whereby she had an ultrasound because of persistent bleeding after having placed the Mirena IUD in February 2016. Her ultrasound demonstrated the following:  Uterus measures 7.9 x 4.8 x 4.0 cm with endometrial stripe of 15.7 mm. The IUD was seen in the proper position. Endometrium was prominent with a hypoechoic avascular focus measuring 20 x 11 mm. Right ovary thinwall cyst with a thin septum measuring 16 x 19 mm echogenic focus 3 mm was noted avascular. Left R was normal no fluid in the cul-de-sac.  She continued to bleed shortly thereafter she was placed on Vibramycin 100 mg twice a day for 7 days in the event of endometritis. She didn't return back to the office several days later in the ultrasound demonstrated the following:  Uterus measures 7.9 x 4.8 x 4.0 cm within a major stripe of 8.9 mm. IUD was seen in the lower uterine segment above the cervix. Small calcified area of left ovary measuring 3 mm was noted. A thinwall echo-free follicle measuring 23 x 20 x 22 mm avascular was noted on the left. The small cyst with small septum noted on the left ovary on previous scan not present. There was no fluid in the cul-de-sac.  Patient was presented with above findings it was recommended to remove the IUD to relieve her symptoms of cramping and irregular bleeding since the IUD was sitting in the lower uterine segment year the cervix. The cervix was cleansed with Betadine solution and a [place] clamp was used to grasp the IUD string and was retrieved shown to the patient and discarded.   She was instructed to use barrier contraception for 3 months she reports normal menstrual cycles and for heavy cycles she was taking Lysteda 650 mg 2 tablets 3 times a day which helped. She is on her last day of her menstrual cycle and the IUD is being placed under ultrasound guidance.                                                                    IUD procedure note         Patient presented to the office today for placement of Mirena IUD. The patient had previously been provided with literature information on this method of contraception. The risks benefits and pros and cons were discussed and all her questions were answered. She is fully aware that this form of contraception is 99% effective and is good for 5 years.  Pelvic exam: Bartholin urethra Skene glands: Within normal limits Vagina: No lesions or discharge, menstrual blood present Cervix: No lesions or discharge Uterus: Anteverted position Adnexa: No masses or tenderness Rectal exam: Not done  The cervix was cleansed with Betadine solution. A single-tooth tenaculum was placed on the anterior cervical lip. The uterus sounded to 8 centimeter. The IUD was shown to the patient and inserted in a sterile fashion. The IUD string was trimmed. The single-tooth tenaculum was removed. Patient was instructed to return back to the office in one month for follow up.       Ultrasound today demonstrated the following: Uterus measures 7.2 x 3.5 cm within reach or strep 2.3 mm. A right thick wall cyst echogenic cystic measuring 17 x  17 mm was noted with no color-flow to it. Left ovary normal. No fluid in the cul-de-sac. Transabdominal images the IUD placement was completed and was found to be in the  Proper position in the uterus.

## 2015-07-24 ENCOUNTER — Encounter: Payer: Self-pay | Admitting: Gynecology

## 2015-08-22 ENCOUNTER — Encounter: Payer: Self-pay | Admitting: Gynecology

## 2015-08-22 ENCOUNTER — Ambulatory Visit (INDEPENDENT_AMBULATORY_CARE_PROVIDER_SITE_OTHER): Payer: Federal, State, Local not specified - PPO | Admitting: Gynecology

## 2015-08-22 VITALS — BP 128/86

## 2015-08-22 DIAGNOSIS — Z30431 Encounter for routine checking of intrauterine contraceptive device: Secondary | ICD-10-CM

## 2015-08-22 NOTE — Progress Notes (Addendum)
   Patient is a 19 year old who presented to the office today for 1 month follow-up after having placed the Mirena IUD. Patient just recently started her menses. Otherwise she has done well. Patient when question was not certain if at an earlier age she received the HPV vaccine series no check with her mother and inform the office so that we can update our records. Patient's flu vaccine is up-to-date.  Exam: Abdomen: Soft nontender no rebound or guarding Pelvic: Bartholin urethra Skene was within normal limits Vagina: Menstrual blood present Cervix: IUD string visualized the string was trimmed Bimanual exam: Uterus anteverted normal size shape and consistency Adnexa: No palpable mass or tenderness Rectal exam not done  Assessment/plan: One month status post placement of Mirena IUD doing well. Review of patient's records indicated that her last annual exam was in August 2015. She will be informed to return back to the office in 2016 for annual exam and first Pap smear.

## 2015-09-23 ENCOUNTER — Encounter: Payer: Federal, State, Local not specified - PPO | Admitting: Gynecology

## 2015-09-24 ENCOUNTER — Encounter: Payer: Self-pay | Admitting: Gynecology

## 2015-09-24 ENCOUNTER — Other Ambulatory Visit (HOSPITAL_COMMUNITY)
Admission: RE | Admit: 2015-09-24 | Discharge: 2015-09-24 | Disposition: A | Discharge status: Home / Self Care | Payer: Federal, State, Local not specified - PPO | Source: Ambulatory Visit | Attending: Gynecology | Admitting: Gynecology

## 2015-09-24 ENCOUNTER — Ambulatory Visit (INDEPENDENT_AMBULATORY_CARE_PROVIDER_SITE_OTHER): Payer: Federal, State, Local not specified - PPO | Admitting: Gynecology

## 2015-09-24 VITALS — BP 130/84 | Ht 65.0 in | Wt 275.0 lb

## 2015-09-24 DIAGNOSIS — L304 Erythema intertrigo: Secondary | ICD-10-CM

## 2015-09-24 DIAGNOSIS — Z113 Encounter for screening for infections with a predominantly sexual mode of transmission: Secondary | ICD-10-CM

## 2015-09-24 DIAGNOSIS — Z01419 Encounter for gynecological examination (general) (routine) without abnormal findings: Secondary | ICD-10-CM | POA: Insufficient documentation

## 2015-09-24 DIAGNOSIS — J0121 Acute recurrent ethmoidal sinusitis: Secondary | ICD-10-CM

## 2015-09-24 MED ORDER — AZITHROMYCIN 250 MG PO TABS: ORAL_TABLET | ORAL | Status: DC

## 2015-09-24 MED ORDER — NYSTATIN-TRIAMCINOLONE 100000-0.1 UNIT/GM-% EX CREA: 1.0000 "application " | TOPICAL_CREAM | Freq: Three times a day (TID) | CUTANEOUS | Status: DC

## 2015-09-24 NOTE — Progress Notes (Signed)
Ruth Evans Mar 07, 1996 [id number]   History:    19 y.o.  for annual gyn exam with had a Mirena IUD placed in 07/23/2015. Patient's having normal menstrual cycles occasions she's having some spotting which she states is decreasing in quantity now. Patient was last sexually active 2 months ago. This will be patient's first Pap smear today. Patient states her flu vaccine is up-to-date. For the past several days she's been complaining of sinus congestion postnasal drip. She does have history of asthma for which she uses an inhaler when necessary. Her PCP has been doing her blood work.  Past medical history,surgical history, family history and social history were all reviewed and documented in the EPIC chart.  Gynecologic History No LMP recorded. Contraception: IUD Last Pap: No previous study. Results were: No previous study Last mammogram: Not indicated. Results were: Not indicated  Obstetric History OB History  Gravida Para Term Preterm AB SAB TAB Ectopic Multiple Living  0                  ROS: A ROS was performed and pertinent positives and negatives are included in the history.  GENERAL: No fevers or chills. HEENT: No change in vision, no earache, sore throat or sinus congestion. NECK: No pain or stiffness. CARDIOVASCULAR: No chest pain or pressure. No palpitations. PULMONARY: No shortness of breath, cough or wheeze. GASTROINTESTINAL: No abdominal pain, nausea, vomiting or diarrhea, melena or bright red blood per rectum. GENITOURINARY: No urinary frequency, urgency, hesitancy or dysuria. MUSCULOSKELETAL: No joint or muscle pain, no back pain, no recent trauma. DERMATOLOGIC: No rash, no itching, no lesions. ENDOCRINE: No polyuria, polydipsia, no heat or cold intolerance. No recent change in weight. HEMATOLOGICAL: No anemia or easy bruising or bleeding. NEUROLOGIC: No headache, seizures, numbness, tingling or weakness. PSYCHIATRIC: No depression, no loss of interest in normal activity or  change in sleep pattern.     Exam: chaperone present  BP 130/84 mmHg  Ht  (1.651 m)  Wt 275 lb (124.739 kg)  BMI 45.76 kg/m2  Body mass index is 45.76 kg/(m^2).  General appearance : Well developed well nourished female. No acute distress HEENT: Tender frontal sinuses,: no retinal hemorrhage or exudates,  Neck supple, trachea midline, no carotid bruits, no thyroidmegaly Lungs: Clear to auscultation, no rhonchi or wheezes, or rib retractions  Heart: Regular rate and rhythm, no murmurs or gallops Breast:Examined in sitting and supine position were symmetrical in appearance, no palpable masses or tenderness,  no skin retraction, no nipple inversion, no nipple discharge, no skin discoloration, no axillary or supraclavicular lymphadenopathy Abdomen: no palpable masses or tenderness, no rebound or guarding Extremities: no edema or skin discoloration or tenderness  Pelvic: Intertrigo medial thighs  Bartholin, Urethra, Skene Glands: Within normal limits             Vagina: No gross lesions or discharge  Cervix: No gross lesions or discharge, IUD string seen  Uterus  anteverted, normal size, shape and consistency, non-tender and mobile  Adnexa  Without masses or tenderness  Anus and perineum  normal   Rectovaginal  normal sphincter tone without palpated masses or tenderness             Hemoccult not indicated     Assessment/Plan:  19 y.o. female for annual exam with clinical evidence of intertrigo of medial thighs will be prescribed mytrex cream to apply 2-3 times a day for 7-10 days and then once a week. Patient is overweight. Pap smear without  HPV done today according to the new guidelines. For patient's sinusitis is going to be started on Zithromax 250 mg by mouth she will take 2 tablets today then 1 by mouth daily for 3 days. She will buy over-the-counter Mucinex. PCP is her blood work and vaccines are up-to-date. GC and Chlamydia culture obtained today.   Ok EdwardsFERNANDEZ,Byron Tipping H MD,  11:17 AM 09/24/2015

## 2015-09-24 NOTE — Patient Instructions (Signed)
Nystatin; Triamcinolone cream or ointment What is this medicine? NYSTATIN; TRIAMCINOLONE (nye STAT in; trye am SIN oh lone) is a combination of an antifungal medicine and a steroid. It is used to treat certain kinds of fungal or yeast infections of the skin. This medicine may be used for other purposes; ask your health care provider or pharmacist if you have questions. What should I tell my health care provider before I take this medicine? They need to know if you have any of these conditions: -large areas of burned or damaged skin -skin wasting or thinning -peripheral vascular disease or poor circulation -an unusual or allergic reaction to nystatin, triamcinolone, other corticosteroids, other medicines, foods, dyes, or preservatives -pregnant or trying to get pregnant -breast-feeding How should I use this medicine? This medicine is for external use only. Do not take by mouth. Follow the directions on the prescription label. Wash your hands before and after use. If treating hand or nail infections, wash hands before use only. Apply a thin layer of this medicine to the affected area and rub in gently. Do not use on healthy skin or over large areas of skin. Do not get this medicine in your eyes. If you do, rinse out with plenty of cool tap water. When applying to the groin area, apply a limited amount and do not use for longer than 2 weeks unless directed to by your doctor or health care professional. Do not cover or wrap the treated area with an airtight bandage (such as a plastic bandage). Use the full course of treatment prescribed, even if you think the infection is getting better. Use at regular intervals. Do not use your medicine more often than directed. Do not use this medicine for any condition other than the one for which it was prescribed. Talk to your pediatrician regarding the use of this medicine in children. While this drug may be prescribed for selected conditions, precautions do apply.  Children being treated in the diaper area should not wear tight-fitting diapers or plastic pants. Elderly patients are more likely to have damaged skin through aging, and this may increase side effects. This medicine should only be used for brief periods and infrequently in older patients. Overdosage: If you think you have taken too much of this medicine contact a poison control center or emergency room at once. NOTE: This medicine is only for you. Do not share this medicine with others. What if I miss a dose? If you miss a dose, use it as soon as you can. If it is almost time for your next dose, use only that dose. Do not use double or extra doses. What may interact with this medicine? Interactions are not expected. Do not use any other skin products on the affected area without telling your doctor or health care professional. This list may not describe all possible interactions. Give your health care provider a list of all the medicines, herbs, non-prescription drugs, or dietary supplements you use. Also tell them if you smoke, drink alcohol, or use illegal drugs. Some items may interact with your medicine. What should I watch for while using this medicine? Tell your doctor or health care professional if your symptoms do not start to get better within 1 week when treating the groin area or within 2 weeks when treating the feet. . Tell your doctor or health care professional if you develop sores or blisters that do not heal properly. If your skin infection returns after stopping this medicine, contact your doctor  or health care professional. If you are using this medicine to treat an infection in the groin area, do not wear underwear that is tight-fitting or made from synthetic fibers such as rayon or nylon. Instead, wear loose-fitting, cotton underwear. Also dry the area completely after bathing. What side effects may I notice from receiving this medicine? Side effects that you should report to your  doctor or health care professional as soon as possible: -burning or itching of the skin -dark red spots on the skin -loss of feeling on skin -painful, red, pus-filled blisters in hair follicles -skin infection -thinning of the skin or sunburn: more likely if applied to the face Side effects that usually do not require medical attention (report to your doctor or health care professional if they continue or are bothersome): -dry or peeling skin -skin irritation This list may not describe all possible side effects. Call your doctor for medical advice about side effects. You may report side effects to FDA at 1-800-FDA-1088. Where should I keep my medicine? Keep out of the reach of children. Store at room temperature between 15 and 30 degrees C (59 and 86 degrees F). Do not freeze. Throw away any unused medicine after the expiration date. NOTE: This sheet is a summary. It may not cover all possible information. If you have questions about this medicine, talk to your doctor, pharmacist, or health care provider.    2016, Elsevier/Gold Standard. (2008-04-19 17:29:26) Azithromycin tablets What is this medicine? AZITHROMYCIN (az ith roe MYE sin) is a macrolide antibiotic. It is used to treat or prevent certain kinds of bacterial infections. It will not work for colds, flu, or other viral infections. This medicine may be used for other purposes; ask your health care provider or pharmacist if you have questions. What should I tell my health care provider before I take this medicine? They need to know if you have any of these conditions: -kidney disease -liver disease -irregular heartbeat or heart disease -an unusual or allergic reaction to azithromycin, erythromycin, other macrolide antibiotics, foods, dyes, or preservatives -pregnant or trying to get pregnant -breast-feeding How should I use this medicine? Take this medicine by mouth with a full glass of water. Follow the directions on the  prescription label. The tablets can be taken with food or on an empty stomach. If the medicine upsets your stomach, take it with food. Take your medicine at regular intervals. Do not take your medicine more often than directed. Take all of your medicine as directed even if you think your are better. Do not skip doses or stop your medicine early. Talk to your pediatrician regarding the use of this medicine in children. Special care may be needed. Overdosage: If you think you have taken too much of this medicine contact a poison control center or emergency room at once. NOTE: This medicine is only for you. Do not share this medicine with others. What if I miss a dose? If you miss a dose, take it as soon as you can. If it is almost time for your next dose, take only that dose. Do not take double or extra doses. What may interact with this medicine? Do not take this medicine with any of the following medications: -lincomycin This medicine may also interact with the following medications: -amiodarone -antacids -birth control pills -cyclosporine -digoxin -magnesium -nelfinavir -phenytoin -warfarin This list may not describe all possible interactions. Give your health care provider a list of all the medicines, herbs, non-prescription drugs, or dietary supplements you  use. Also tell them if you smoke, drink alcohol, or use illegal drugs. Some items may interact with your medicine. What should I watch for while using this medicine? Tell your doctor or health care professional if your symptoms do not improve. Do not treat diarrhea with over the counter products. Contact your doctor if you have diarrhea that lasts more than 2 days or if it is severe and watery. This medicine can make you more sensitive to the sun. Keep out of the sun. If you cannot avoid being in the sun, wear protective clothing and use sunscreen. Do not use sun lamps or tanning beds/booths. What side effects may I notice from receiving  this medicine? Side effects that you should report to your doctor or health care professional as soon as possible: -allergic reactions like skin rash, itching or hives, swelling of the face, lips, or tongue -confusion, nightmares or hallucinations -dark urine -difficulty breathing -hearing loss -irregular heartbeat or chest pain -pain or difficulty passing urine -redness, blistering, peeling or loosening of the skin, including inside the mouth -white patches or sores in the mouth -yellowing of the eyes or skin Side effects that usually do not require medical attention (report to your doctor or health care professional if they continue or are bothersome): -diarrhea -dizziness, drowsiness -headache -stomach upset or vomiting -tooth discoloration -vaginal irritation This list may not describe all possible side effects. Call your doctor for medical advice about side effects. You may report side effects to FDA at 1-800-FDA-1088. Where should I keep my medicine? Keep out of the reach of children. Store at room temperature between 15 and 30 degrees C (59 and 86 degrees F). Throw away any unused medicine after the expiration date. NOTE: This sheet is a summary. It may not cover all possible information. If you have questions about this medicine, talk to your doctor, pharmacist, or health care provider.    2016, Elsevier/Gold Standard. (2013-05-03 15:38:48) Sinusitis, Adult Sinusitis is redness, soreness, and inflammation of the paranasal sinuses. Paranasal sinuses are air pockets within the bones of your face. They are located beneath your eyes, in the middle of your forehead, and above your eyes. In healthy paranasal sinuses, mucus is able to drain out, and air is able to circulate through them by way of your nose. However, when your paranasal sinuses are inflamed, mucus and air can become trapped. This can allow bacteria and other germs to grow and cause infection. Sinusitis can develop  quickly and last only a short time (acute) or continue over a long period (chronic). Sinusitis that lasts for more than 12 weeks is considered chronic. CAUSES Causes of sinusitis include:  Allergies.  Structural abnormalities, such as displacement of the cartilage that separates your nostrils (deviated septum), which can decrease the air flow through your nose and sinuses and affect sinus drainage.  Functional abnormalities, such as when the small hairs (cilia) that line your sinuses and help remove mucus do not work properly or are not present. SIGNS AND SYMPTOMS Symptoms of acute and chronic sinusitis are the same. The primary symptoms are pain and pressure around the affected sinuses. Other symptoms include:  Upper toothache.  Earache.  Headache.  Bad breath.  Decreased sense of smell and taste.  A cough, which worsens when you are lying flat.  Fatigue.  Fever.  Thick drainage from your nose, which often is green and may contain pus (purulent).  Swelling and warmth over the affected sinuses. DIAGNOSIS Your health care provider will perform a  physical exam. During your exam, your health care provider may perform any of the following to help determine if you have acute sinusitis or chronic sinusitis:  Look in your nose for signs of abnormal growths in your nostrils (nasal polyps).  Tap over the affected sinus to check for signs of infection.  View the inside of your sinuses using an imaging device that has a light attached (endoscope). If your health care provider suspects that you have chronic sinusitis, one or more of the following tests may be recommended:  Allergy tests.  Nasal culture. A sample of mucus is taken from your nose, sent to a lab, and screened for bacteria.  Nasal cytology. A sample of mucus is taken from your nose and examined by your health care provider to determine if your sinusitis is related to an allergy. TREATMENT Most cases of acute sinusitis  are related to a viral infection and will resolve on their own within 10 days. Sometimes, medicines are prescribed to help relieve symptoms of both acute and chronic sinusitis. These may include pain medicines, decongestants, nasal steroid sprays, or saline sprays. However, for sinusitis related to a bacterial infection, your health care provider will prescribe antibiotic medicines. These are medicines that will help kill the bacteria causing the infection. Rarely, sinusitis is caused by a fungal infection. In these cases, your health care provider will prescribe antifungal medicine. For some cases of chronic sinusitis, surgery is needed. Generally, these are cases in which sinusitis recurs more than 3 times per year, despite other treatments. HOME CARE INSTRUCTIONS  Drink plenty of water. Water helps thin the mucus so your sinuses can drain more easily.  Use a humidifier.  Inhale steam 3-4 times a day (for example, sit in the bathroom with the shower running).  Apply a warm, moist washcloth to your face 3-4 times a day, or as directed by your health care provider.  Use saline nasal sprays to help moisten and clean your sinuses.  Take medicines only as directed by your health care provider.  If you were prescribed either an antibiotic or antifungal medicine, finish it all even if you start to feel better. SEEK IMMEDIATE MEDICAL CARE IF:  You have increasing pain or severe headaches.  You have nausea, vomiting, or drowsiness.  You have swelling around your face.  You have vision problems.  You have a stiff neck.  You have difficulty breathing.   This information is not intended to replace advice given to you by your health care provider. Make sure you discuss any questions you have with your health care provider.   Document Released: 09/27/2005 Document Revised: 10/18/2014 Document Reviewed: 10/12/2011 Elsevier Interactive Patient Education 2016 [employer]. [employer] Intertrigo is a skin condition that occurs in between folds of skin in places on the body that rub together a lot and do not get much ventilation. It is caused by heat, moisture, friction, sweat retention, and lack of air circulation, which produces red, irritated patches and, sometimes, scaling or drainage. People who have diabetes, who are obese, or who have treatment with antibiotics are at increased risk for intertrigo. The most common sites for intertrigo to occur include:  The groin.  The breasts.  The armpits.  Folds of abdominal skin.  Webbed spaces between the fingers or toes. Intertrigo may be aggravated by:  Sweat.  Feces.  Yeast or bacteria that are present near skin folds.  Urine.  Vaginal discharge. HOME CARE INSTRUCTIONS  The following steps can be taken  to reduce friction and keep the affected area cool and dry:  Expose skin folds to the air.  Keep deep skin folds separated with cotton or linen cloth. Avoid tight fitting clothing that could cause chafing.  Wear open-toed shoes or sandals to help reduce moisture between the toes.  Apply absorbent powders to affected areas as directed by your caregiver.  Apply over-the-counter barrier pastes, such as zinc oxide, as directed by your caregiver.  If you develop a fungal infection in the affected area, your caregiver may have you use antifungal creams. SEEK MEDICAL CARE IF:   The rash is not improving after 1 week of treatment.  The rash is getting worse (more red, more swollen, more painful, or spreading).  You have a fever or chills. MAKE SURE YOU:   Understand these instructions.  Will watch your condition.  Will get help right away if you are not doing well or get worse.   This information is not intended to replace advice given to you by your health care provider. Make sure you discuss any questions you have with your health care provider.   Document Released: 09/27/2005 Document  Revised: 12/20/2011 Document Reviewed: 03/31/2015 Elsevier Interactive Patient Education [employer]

## 2015-09-25 LAB — GC/CHLAMYDIA PROBE AMP
CT PROBE, AMP APTIMA: NOT DETECTED
GC PROBE AMP APTIMA: NOT DETECTED

## 2015-09-25 LAB — CYTOLOGY - PAP

## 2016-03-04 ENCOUNTER — Observation Stay (HOSPITAL_COMMUNITY)
Admission: AD | Admit: 2016-03-04 | Discharge: 2016-03-06 | Disposition: A | Discharge status: Home / Self Care | Payer: Federal, State, Local not specified - PPO | Source: Ambulatory Visit | Attending: Gynecology | Admitting: Gynecology

## 2016-03-04 ENCOUNTER — Emergency Department (HOSPITAL_COMMUNITY): Payer: Federal, State, Local not specified - PPO

## 2016-03-04 ENCOUNTER — Encounter (HOSPITAL_COMMUNITY): Payer: Self-pay | Admitting: Emergency Medicine

## 2016-03-04 DIAGNOSIS — Z975 Presence of (intrauterine) contraceptive device: Secondary | ICD-10-CM | POA: Diagnosis not present

## 2016-03-04 DIAGNOSIS — N83209 Unspecified ovarian cyst, unspecified side: Secondary | ICD-10-CM

## 2016-03-04 DIAGNOSIS — N83201 Unspecified ovarian cyst, right side: Secondary | ICD-10-CM | POA: Diagnosis not present

## 2016-03-04 DIAGNOSIS — R109 Unspecified abdominal pain: Secondary | ICD-10-CM

## 2016-03-04 DIAGNOSIS — R1032 Left lower quadrant pain: Secondary | ICD-10-CM | POA: Diagnosis present

## 2016-03-04 DIAGNOSIS — N739 Female pelvic inflammatory disease, unspecified: Secondary | ICD-10-CM | POA: Diagnosis not present

## 2016-03-04 DIAGNOSIS — N730 Acute parametritis and pelvic cellulitis: Principal | ICD-10-CM

## 2016-03-04 DIAGNOSIS — N9489 Other specified conditions associated with female genital organs and menstrual cycle: Secondary | ICD-10-CM

## 2016-03-04 LAB — CBC WITH DIFFERENTIAL/PLATELET
Basophils Absolute: 0 10*3/uL (ref 0.0–0.1)
Basophils Relative: 0 %
EOS PCT: 2 %
Eosinophils Absolute: 0.3 10*3/uL (ref 0.0–0.7)
HEMATOCRIT: 41.9 % (ref 36.0–46.0)
Hemoglobin: 14.2 g/dL (ref 12.0–15.0)
LYMPHS ABS: 2.3 10*3/uL (ref 0.7–4.0)
LYMPHS PCT: 14 %
MCH: 29.7 pg (ref 26.0–34.0)
MCHC: 33.9 g/dL (ref 30.0–36.0)
MCV: 87.7 fL (ref 78.0–100.0)
MONO ABS: 1.2 10*3/uL — AB (ref 0.1–1.0)
MONOS PCT: 8 %
NEUTROS ABS: 12.2 10*3/uL — AB (ref 1.7–7.7)
Neutrophils Relative %: 76 %
PLATELETS: 246 10*3/uL (ref 150–400)
RBC: 4.78 MIL/uL (ref 3.87–5.11)
RDW: 12.3 % (ref 11.5–15.5)
WBC: 16.1 10*3/uL — ABNORMAL HIGH (ref 4.0–10.5)

## 2016-03-04 LAB — COMPREHENSIVE METABOLIC PANEL
ALBUMIN: 4.7 g/dL (ref 3.5–5.0)
ALK PHOS: 56 U/L (ref 38–126)
ALT: 22 U/L (ref 14–54)
ANION GAP: 9 (ref 5–15)
AST: 24 U/L (ref 15–41)
BILIRUBIN TOTAL: 0.5 mg/dL (ref 0.3–1.2)
BUN: 14 mg/dL (ref 6–20)
CO2: 25 mmol/L (ref 22–32)
Calcium: 9.2 mg/dL (ref 8.9–10.3)
Chloride: 104 mmol/L (ref 101–111)
Creatinine, Ser: 0.9 mg/dL (ref 0.44–1.00)
GFR calc Af Amer: >60 mL/min (ref >60)
GFR calc non Af Amer: >60 mL/min (ref >60)
GLUCOSE: 86 mg/dL (ref 65–99)
POTASSIUM: 4 mmol/L (ref 3.5–5.1)
SODIUM: 138 mmol/L (ref 135–145)
TOTAL PROTEIN: 7.7 g/dL (ref 6.5–8.1)

## 2016-03-04 LAB — WET PREP, GENITAL
SPERM: NONE SEEN
Trich, Wet Prep: NONE SEEN
YEAST WET PREP: NONE SEEN

## 2016-03-04 LAB — URINALYSIS, ROUTINE W REFLEX MICROSCOPIC
Bilirubin Urine: NEGATIVE
GLUCOSE, UA: NEGATIVE mg/dL
HGB URINE DIPSTICK: NEGATIVE
Ketones, ur: NEGATIVE mg/dL
LEUKOCYTES UA: NEGATIVE
Nitrite: NEGATIVE
PH: 5.5 (ref 5.0–8.0)
Protein, ur: NEGATIVE mg/dL
Specific Gravity, Urine: 1.013 (ref 1.005–1.030)

## 2016-03-04 LAB — LIPASE, BLOOD: Lipase: 24 U/L (ref 11–51)

## 2016-03-04 LAB — HCG, QUANTITATIVE, PREGNANCY: hCG, Beta Chain, Quant, S: <1 m[IU]/mL (ref <5)

## 2016-03-04 LAB — PREGNANCY, URINE: Preg Test, Ur: NEGATIVE

## 2016-03-04 MED ORDER — DOXYCYCLINE HYCLATE 100 MG IV SOLR
100.0000 mg | Freq: Once | INTRAVENOUS | Status: AC
  Administered 2016-03-05: 100 mg via INTRAVENOUS
  Filled 2016-03-04: qty 100

## 2016-03-04 MED ORDER — IOPAMIDOL (ISOVUE-300) INJECTION 61%
100.0000 mL | Freq: Once | INTRAVENOUS | Status: AC | PRN
Start: 2016-03-04 — End: 2016-03-04
  Administered 2016-03-04: 100 mL via INTRAVENOUS

## 2016-03-04 MED ORDER — HYDROMORPHONE HCL 1 MG/ML IJ SOLN
0.5000 mg | Freq: Once | INTRAMUSCULAR | Status: AC
  Administered 2016-03-04: 0.5 mg via INTRAVENOUS
  Filled 2016-03-04: qty 1

## 2016-03-04 MED ORDER — ONDANSETRON HCL 4 MG/2ML IJ SOLN
4.0000 mg | Freq: Once | INTRAMUSCULAR | Status: AC
  Administered 2016-03-04: 4 mg via INTRAVENOUS
  Filled 2016-03-04: qty 2

## 2016-03-04 MED ORDER — DEXTROSE 5 % IV SOLN
1.0000 g | Freq: Once | INTRAVENOUS | Status: AC
  Administered 2016-03-04: 1 g via INTRAVENOUS
  Filled 2016-03-04: qty 10

## 2016-03-04 NOTE — ED Provider Notes (Signed)
CSN: [id number]     Arrival date & time 03/04/16  1848 History   First MD Initiated Contact with Patient 03/04/16 1944     Chief Complaint  Patient presents with  . Abdominal Pain     (Consider location/radiation/quality/duration/timing/severity/associated sxs/prior Treatment) Patient is a 20 y.o. female presenting with abdominal pain. The history is provided by the patient (Patient complains of left lower quadrant and suprapubic abdominal pain with nausea for 3 days).  Abdominal Pain Pain location:  Suprapubic Pain quality: aching   Pain radiates to:  Does not radiate Pain severity:  Moderate Onset quality:  Sudden Timing:  Constant Progression:  Waxing and waning Chronicity:  New Context: not alcohol use   Associated symptoms: nausea   Associated symptoms: no chest pain, no cough, no diarrhea, no fatigue and no hematuria     History reviewed. No pertinent past medical history. Past Surgical History  Procedure Laterality Date  . Nasal septum surgery    . Wisdom tooth extraction     Family History  Problem Relation Age of Onset  . Diabetes Maternal Aunt   . Cancer Maternal Grandfather     THYROID AND SKIN  . Breast cancer Paternal Grandmother   . Hypertension Paternal Grandfather   . Heart disease Paternal Grandfather    Social History  Substance Use Topics  . Smoking status: Never Smoker   . Smokeless tobacco: Never Used  . Alcohol Use: No   OB History    Gravida Para Term Preterm AB TAB SAB Ectopic Multiple Living   0              Review of Systems  Constitutional: Negative for appetite change and fatigue.  HENT: Negative for congestion, ear discharge and sinus pressure.   Eyes: Negative for discharge.  Respiratory: Negative for cough.   Cardiovascular: Negative for chest pain.  Gastrointestinal: Positive for nausea and abdominal pain. Negative for diarrhea.  Genitourinary: Negative for frequency and hematuria.  Musculoskeletal: Negative for back pain.   Skin: Negative for rash.  Neurological: Negative for seizures and headaches.  Psychiatric/Behavioral: Negative for hallucinations.      Allergies  Codeine and Nsaids  Home Medications   Prior to Admission medications   Medication Sig Start Date End Date Taking? Authorizing Provider  albuterol (PROVENTIL HFA;VENTOLIN HFA) 108 (90 BASE) MCG/ACT inhaler Inhale 2 puffs into the lungs every 6 (six) hours as needed for wheezing or shortness of breath.   Yes Historical Provider, MD  Biotin 1 MG CAPS Take 1 capsule by mouth daily.   Yes Historical Provider, MD  cetirizine (ZYRTEC) 10 MG tablet Take 10 mg by mouth daily.   Yes Historical Provider, MD  fluticasone (FLONASE) 50 MCG/ACT nasal spray Place into both nostrils daily.   Yes Historical Provider, MD  ibuprofen (ADVIL,MOTRIN) 200 MG tablet Take 200 mg by mouth every 6 (six) hours as needed for moderate pain.   Yes Historical Provider, MD  Multiple Vitamin (MULTIVITAMIN WITH MINERALS) TABS Take 1 tablet by mouth daily.   Yes Historical Provider, MD  azithromycin (ZITHROMAX) 250 MG tablet Take 2 tablets today and then 1 daily Patient not taking: Reported on 03/04/2016 09/24/15   Ok Edwards, MD  nystatin-triamcinolone [facility]) cream Apply 1 application topically 3 (three) times daily. Patient not taking: Reported on 03/04/2016 09/24/15   Ok Edwards, MD  tranexamic acid (LYSTEDA) 650 MG TABS tablet Take 2 tablets (1,300 mg total) by mouth 3 (three) times daily. Patient not taking: Reported  on 09/24/2015 03/21/15   Ok Edwards, MD   BP 109/68 mmHg  Pulse 103  Temp(Src) 98.7 F (37.1 C) (Oral)  Resp 18  SpO2 99%  LMP 02/19/2016 Physical Exam  Constitutional: She is oriented to person, place, and time. She appears well-developed.  HENT:  Head: Normocephalic.  Eyes: Conjunctivae and EOM are normal. No scleral icterus.  Neck: Neck supple. No thyromegaly present.  Cardiovascular: Normal rate and regular rhythm.  Exam  reveals no gallop and no friction rub.   No murmur heard. Pulmonary/Chest: No stridor. She has no wheezes. She has no rales. She exhibits no tenderness.  Abdominal: She exhibits no distension. There is tenderness. There is no rebound.  Moderate tenderness suprapubic and left lower quadrant  Genitourinary:  Yellow discharge tender cervix bilateral tender adenopathy.  Musculoskeletal: Normal range of motion. She exhibits no edema.  Lymphadenopathy:    She has no cervical adenopathy.  Neurological: She is oriented to person, place, and time. She exhibits normal muscle tone. Coordination normal.  Skin: No rash noted. No erythema.  Psychiatric: She has a normal mood and affect. Her behavior is normal.    ED Course  Procedures (including critical care time) Labs Review Labs Reviewed  CBC WITH DIFFERENTIAL/PLATELET - Abnormal; Notable for the following:    WBC 16.1 (*)    Neutro Abs 12.2 (*)    Monocytes Absolute 1.2 (*)    All other components within normal limits  WET PREP, GENITAL  LIPASE, BLOOD  COMPREHENSIVE METABOLIC PANEL  URINALYSIS, ROUTINE W REFLEX MICROSCOPIC (NOT AT [facility])  HCG, QUANTITATIVE, PREGNANCY  PREGNANCY, URINE  I-STAT BETA HCG BLOOD, ED ([facility], [facility], [facility] ONLY)  GC/CHLAMYDIA PROBE AMP () NOT AT [facility]    Imaging Review Ct Abdomen Pelvis W Contrast  03/04/2016  CLINICAL DATA:  Acute onset of left upper and lower quadrant abdominal pain, and lower abdominal cramping. Initial encounter. EXAM: CT ABDOMEN AND PELVIS WITH CONTRAST TECHNIQUE: Multidetector CT imaging of the abdomen and pelvis was performed using the standard protocol following bolus administration of intravenous contrast. CONTRAST:  ISOVUE-300 IOPAMIDOL (ISOVUE-300) INJECTION 61% COMPARISON:  Pelvic ultrasound performed 07/23/2015, and CT of the abdomen and pelvis performed 01/02/2013 FINDINGS: The visualized lung bases are clear. The liver and spleen are unremarkable in appearance. The gallbladder  is within normal limits. The pancreas and adrenal glands are unremarkable. The kidneys are unremarkable in appearance. There is no evidence of hydronephrosis. No renal or ureteral stones are seen. No perinephric stranding is appreciated. The small bowel is unremarkable in appearance. The stomach is within normal limits. No acute vascular abnormalities are seen. There is minimal inflammation about the appendix, though this is thought be secondary to the pelvic process. The appendix remains normal in caliber. The colon is unremarkable in appearance. The bladder is mildly distended and grossly unremarkable. The uterus is unremarkable in appearance, with an intrauterine device noted in expected position. Minimal soft tissue inflammation is seen tracking about the uterus, and soft tissue inflammation is seen more prominently about the adnexa. There is a 4.8 x 3.5 cm cystic structure at the right adnexa, with surrounding soft tissue inflammation, which may reflect a tubo-ovarian abscess or pyosalpinx. Trace associated fluid is seen tracking along the mesentery. No inguinal lymphadenopathy is seen. No acute osseous abnormalities are identified. IMPRESSION: 1. 4.8 x 3.5 cm cystic structure at the right adnexa, with surrounding soft tissue inflammation, which may reflect a tubo-ovarian abscess or pyosalpinx. Trace associated free fluid is seen tracking  along the mesentery. 2. Mild soft tissue inflammation tracks about the pelvis. Electronically Signed   By: Roanna RaiderJeffery  Chang M.D.   On: 03/04/2016 22:31   I have personally reviewed and evaluated these images and lab results as part of my medical decision-making.   EKG Interpretation None      MDM   Final diagnoses:  None    Patient has abdominal pain and possible tubo-ovarian abscess. I spoke with Dr. Ardyth HarpsHernandez who wants the patient transferred to maternity admissions. [facility] and he will make her  disposition    Bethann BerkshireJoseph Almond Fitzgibbon, MD 03/05/16 [phone]

## 2016-03-04 NOTE — ED Notes (Signed)
Pt in CT.

## 2016-03-04 NOTE — ED Notes (Signed)
IV team still attempting IV insertion

## 2016-03-04 NOTE — ED Notes (Signed)
IV team attempting IV insertion

## 2016-03-04 NOTE — ED Notes (Signed)
Pt c/o L upper and lower abdominal pain since 3am this morning. Pt c/o cramping in lower abdomen as well. Denies N/V/D. A&Ox4 and ambulatory. Pt also adds that per her mother she fainted while on the way to the hospital. Pt denies dizziness/lightheadedness at this time.

## 2016-03-04 NOTE — ED Notes (Signed)
Delay in medication administration due to no IV

## 2016-03-05 ENCOUNTER — Inpatient Hospital Stay (HOSPITAL_COMMUNITY): Payer: Federal, State, Local not specified - PPO

## 2016-03-05 ENCOUNTER — Other Ambulatory Visit: Payer: Self-pay | Admitting: *Deleted

## 2016-03-05 ENCOUNTER — Telehealth: Payer: Self-pay | Admitting: *Deleted

## 2016-03-05 ENCOUNTER — Encounter (HOSPITAL_COMMUNITY): Payer: Self-pay | Admitting: Advanced Practice Midwife

## 2016-03-05 DIAGNOSIS — N730 Acute parametritis and pelvic cellulitis: Secondary | ICD-10-CM | POA: Diagnosis not present

## 2016-03-05 DIAGNOSIS — N739 Female pelvic inflammatory disease, unspecified: Secondary | ICD-10-CM

## 2016-03-05 DIAGNOSIS — N949 Unspecified condition associated with female genital organs and menstrual cycle: Secondary | ICD-10-CM | POA: Diagnosis not present

## 2016-03-05 DIAGNOSIS — R109 Unspecified abdominal pain: Secondary | ICD-10-CM | POA: Diagnosis not present

## 2016-03-05 DIAGNOSIS — N83209 Unspecified ovarian cyst, unspecified side: Secondary | ICD-10-CM

## 2016-03-05 LAB — GC/CHLAMYDIA PROBE AMP (~~LOC~~) NOT AT ARMC
Chlamydia: NEGATIVE
Neisseria Gonorrhea: NEGATIVE

## 2016-03-05 MED ORDER — DOXYCYCLINE HYCLATE 100 MG IV SOLR
100.0000 mg | Freq: Two times a day (BID) | INTRAVENOUS | Status: DC
  Administered 2016-03-05 – 2016-03-06 (×3): 100 mg via INTRAVENOUS
  Filled 2016-03-05 (×4): qty 100

## 2016-03-05 MED ORDER — METRONIDAZOLE 500 MG PO TABS: ORAL_TABLET | ORAL | Status: DC

## 2016-03-05 MED ORDER — SODIUM CHLORIDE 0.9 % IV SOLN
INTRAVENOUS | Status: DC
  Administered 2016-03-05 (×2): via INTRAVENOUS

## 2016-03-05 MED ORDER — METRONIDAZOLE 500 MG PO TABS
500.0000 mg | ORAL_TABLET | Freq: Two times a day (BID) | ORAL | Status: DC
  Administered 2016-03-05 – 2016-03-06 (×3): 500 mg via ORAL
  Filled 2016-03-05 (×5): qty 1

## 2016-03-05 MED ORDER — DEXTROSE 5 % IV SOLN
2.0000 g | Freq: Two times a day (BID) | INTRAVENOUS | Status: DC
  Administered 2016-03-05 – 2016-03-06 (×3): 2 g via INTRAVENOUS
  Filled 2016-03-05 (×4): qty 2

## 2016-03-05 MED ORDER — HYDROMORPHONE HCL 2 MG PO TABS
2.0000 mg | ORAL_TABLET | ORAL | Status: DC | PRN
  Administered 2016-03-05 (×2): 2 mg via ORAL
  Filled 2016-03-05 (×2): qty 1

## 2016-03-05 MED ORDER — HYDROMORPHONE HCL 1 MG/ML IJ SOLN
1.0000 mg | Freq: Once | INTRAMUSCULAR | Status: AC
  Administered 2016-03-05: 1 mg via INTRAVENOUS
  Filled 2016-03-05: qty 1

## 2016-03-05 MED ORDER — FLUCONAZOLE 150 MG PO TABS: 150.0000 mg | ORAL_TABLET | Freq: Once | ORAL | Status: DC

## 2016-03-05 MED ORDER — ACETAMINOPHEN 325 MG PO TABS: 650.0000 mg | ORAL_TABLET | ORAL | Status: DC | PRN

## 2016-03-05 MED ORDER — METOCLOPRAMIDE HCL 5 MG/ML IJ SOLN: 10.0000 mg | Freq: Four times a day (QID) | INTRAMUSCULAR | Status: DC | PRN

## 2016-03-05 MED ORDER — DOXYCYCLINE HYCLATE 50 MG PO CAPS: ORAL_CAPSULE | ORAL | Status: DC

## 2016-03-05 MED ORDER — KETOROLAC TROMETHAMINE 30 MG/ML IJ SOLN
30.0000 mg | Freq: Four times a day (QID) | INTRAMUSCULAR | Status: DC | PRN
  Administered 2016-03-05: 30 mg via INTRAVENOUS
  Filled 2016-03-05: qty 1

## 2016-03-05 NOTE — Progress Notes (Signed)
Dr Lily PeerFernandez phoned in w/follow up appt scheduled on 03/19/16 @3 :30. Pts mother to call office on Tues to confirm that appt is to see MD & for an U/S.

## 2016-03-05 NOTE — Telephone Encounter (Signed)
Per admission to ER on 03/05/16 note Rx will be sent  oral antibiotic with doxycycline 100 mg twice a day for [redacted] weeks along with Flagyl 500 mg twice a day for 2 weeks for possible PID

## 2016-03-05 NOTE — MAU Note (Signed)
PT IS TRANSFER FROM [facility]         SHE WENT TO [facility]   AT 7 PM   FOR L SIDED ABD  PAIN.     HAS HAD LABS , CT , PELVIC,   MEDS.

## 2016-03-05 NOTE — ED Notes (Signed)
[facility] contacted for transport  

## 2016-03-05 NOTE — Progress Notes (Signed)
Subjective: Patient reports tolerating PO and + flatus.    Objective: I have reviewed patient's vital signs, intake and output, medications and labs.  General: alert and cooperative Resp: clear to auscultation bilaterally GI: soft, non-tender; bowel sounds normal; no masses,  no organomegaly Extremities: extremities normal, atraumatic, no cyanosis or edema   Assessment/Plan: PM Rounds: Patient's mother was present in the room findings and treatment have been discussed and all questions are answered. Patient is remained afebrile has tolerated regular diet [facility]. Patient may ambulate this evening. CBC scheduled 0 500 tomorrow. Her last IV dose of Flagyl, Vibramycin at noon tomorrow and she will be discharged today. Prescription for Vibramycin 100 mg 1 by mouth twice a day for [redacted] weeks along with Flagyl 500 mg 1 by mouth twice a day for 2 weeks was called into her pharmacy along with Diflucan 150 mg in the event that she would develop a yeast infection. An appointment has been made for her to see me in 2 weeks for a follow-up ultrasound. My colleague Dr. Duard LarsenBrooks Silva will be rounding on her tomorrow morning and discharge accordingly. I have given her this patient's information and appreciate her covering for me since I will be out of town.    LOS: 0 days    FERNANDEZ,JUAN H 03/05/2016, 2:57 PM

## 2016-03-05 NOTE — Telephone Encounter (Signed)
-----   Message from Ok EdwardsJuan H Fernandez, MD sent at 03/05/2016  8:12 AM EDT ----- Victorino DikeJennifer please call in prescription for Vibramycin 100 mg one by mouth twice a day for 2 weeks #28 tablets. Also Flagyl 500 mg 1 by mouth twice a day for 2 weeks #28. Patient will be discharged from the hospital tomorrow

## 2016-03-05 NOTE — Progress Notes (Deleted)
Social worker consult called.

## 2016-03-05 NOTE — H&P (Signed)
Ruth Evans is an 20 y.o. female who presented to the emergency room last night complaining of low abdominal pain. She stated that her last menstrual period was approximately 16 days ago. Patient has a Mirena IUD that was placed in October 2016. She stated the discomfort*approximate 3 days most in the left lower quadrant. She denied any fever, chills, some nausea but no vomiting. In the emergency room she was afebrile. Patient had a wet prep demonstrated evidence of bacterial vaginosis. A GC and chlamydia culture were obtained pending at time of this dictation her urine pregnancy test was negative as was her comprehensive metabolic panel and her CBC indicated a white blood count of 16,000. She received 2 g cefotetan IV along with Vibramycin 100 mg IV as well. Her CT report in the emergency room as noted below and an ultrasound done at [facility] at's noted below. Patient was afebrile non-tachycardic and was admitted for IV antibiotics for 24 hours before discharging home with oral antibiotics for 2 weeks for suspected PID. Based on the ultrasound Report:  "No ovarian torsion. Complex cystic structure in the right ovary measuring 4.1 cm is likely a hemorrhagic cyst, and correlates with abnormality seen on CT. No evidence of pyosalpinx or tubo-ovarian abscess.     Pertinent Gynecological History: Menses: Last menstrual period 2 weeks ago normal 2-3 days of spotting only Bleeding: See above Contraception: IUD DES exposure: unknown Blood transfusions: none Sexually transmitted diseases: no past history Previous GYN Procedures: IUD placement 2016  Last mammogram: Not indicated Date: Not indicated Last pap: normal Date: 2016 OB History: G 0, P 0   Menstrual History: Menarche age: 48 Patient's last menstrual period was 02/19/2016.    History reviewed. No pertinent past medical history.  Past Surgical History  Procedure Laterality Date  . Nasal septum surgery    . Wisdom tooth  extraction      Family History  Problem Relation Age of Onset  . Diabetes Maternal Aunt   . Cancer Maternal Grandfather     THYROID AND SKIN  . Breast cancer Paternal Grandmother   . Hypertension Paternal Grandfather   . Heart disease Paternal Grandfather     Social History:  reports that she has never smoked. She has never used smokeless tobacco. She reports that she does not drink alcohol. Her drug history is not on file.  Allergies:  Allergies  Allergen Reactions  . Codeine Nausea And Vomiting    Able to take Dilaudid.   . Nsaids Nausea And Vomiting    Vomiting     Facility-administered medications prior to admission  Medication Dose Route Frequency Provider Last Rate Last Dose  . levonorgestrel (MIRENA) 20 MCG/24HR IUD   Intrauterine Once Ok Edwards, MD       Prescriptions prior to admission  Medication Sig Dispense Refill Last Dose  . albuterol (PROVENTIL HFA;VENTOLIN HFA) 108 (90 BASE) MCG/ACT inhaler Inhale 2 puffs into the lungs every 6 (six) hours as needed for wheezing or shortness of breath.   Past Month at Unknown time  . Biotin 1 MG CAPS Take 1 capsule by mouth daily.   03/04/2016 at Unknown time  . cetirizine (ZYRTEC) 10 MG tablet Take 10 mg by mouth daily.   03/04/2016 at Unknown time  . fluticasone (FLONASE) 50 MCG/ACT nasal spray Place into both nostrils daily.   03/04/2016 at Unknown time  . ibuprofen (ADVIL,MOTRIN) 200 MG tablet Take 200 mg by mouth every 6 (six) hours as needed for moderate pain.  03/04/2016 at Unknown time  . Multiple Vitamin (MULTIVITAMIN WITH MINERALS) TABS Take 1 tablet by mouth daily.   03/04/2016 at Unknown time  . azithromycin (ZITHROMAX) 250 MG tablet Take 2 tablets today and then 1 daily (Patient not taking: Reported on 03/04/2016) 6 tablet 0 Completed Course at Unknown time  . nystatin-triamcinolone (MYCOLOG II) cream Apply 1 application topically 3 (three) times daily. (Patient not taking: Reported on 03/04/2016) 30 g 2 Completed  Course at Unknown time  . tranexamic acid (LYSTEDA) 650 MG TABS tablet Take 2 tablets (1,300 mg total) by mouth 3 (three) times daily. (Patient not taking: Reported on 09/24/2015) 30 tablet 5 Completed Course at Unknown time    REVIEW OF SYSTEMS: A ROS was performed and pertinent positives and negatives are included in the history.  GENERAL: No fevers or chills. HEENT: No change in vision, no earache, sore throat or sinus congestion. NECK: No pain or stiffness. CARDIOVASCULAR: No chest pain or pressure. No palpitations. PULMONARY: No shortness of breath, cough or wheeze. GASTROINTESTINAL: No abdominal pain, nausea, vomiting or diarrhea, melena or bright red blood per rectum. GENITOURINARY: No urinary frequency, urgency, hesitancy or dysuria. MUSCULOSKELETAL: No joint or muscle pain, no back pain, no recent trauma. DERMATOLOGIC: No rash, no itching, no lesions. ENDOCRINE: No polyuria, polydipsia, no heat or cold intolerance. No recent change in weight. HEMATOLOGICAL: No anemia or easy bruising or bleeding. NEUROLOGIC: No headache, seizures, numbness, tingling or weakness. PSYCHIATRIC: No depression, no loss of interest in normal activity or change in sleep pattern.     Blood pressure 117/56, pulse 63, temperature 98.3 F (36.8 C), temperature source Oral, resp. rate 18, height 5\' 7"  (1.702 m), last menstrual period 02/19/2016, SpO2 96 %.  Physical Exam:  HEENT:unremarkable Neck:Supple, midline, no thyroid megaly, no carotid bruits Lungs:  Clear to auscultation no rhonchi's or wheezes Heart:Regular rate and rhythm, no murmurs or gallops Breast Exam:Not examined  Abdomen:No abdominal distention positive bowel sounds all 4 quadrants there was some tenderness slightly more on the left and 9 on the right no rebound or guarding  Pelvic:BUSwithin normal limits  Vagina:Yellowish discharge tender cervix bilateral was noted by the ED physician during pelvic exam.  Cervix:See above  Uterus:See above   Adnexa:See above  Extremities: No cords, no edema Rectal:Not examined  Results for orders placed or performed during the hospital encounter of 03/04/16 (from the past 24 hour(s))  Urinalysis, Routine w reflex microscopic     Status: None   Collection Time: 03/04/16  7:40 PM  Result Value Ref Range   Color, Urine YELLOW YELLOW   APPearance CLEAR CLEAR   Specific Gravity, Urine 1.013 1.005 - 1.030   pH 5.5 5.0 - 8.0   Glucose, UA NEGATIVE NEGATIVE mg/dL   Hgb urine dipstick NEGATIVE NEGATIVE   Bilirubin Urine NEGATIVE NEGATIVE   Ketones, ur NEGATIVE NEGATIVE mg/dL   Protein, ur NEGATIVE NEGATIVE mg/dL   Nitrite NEGATIVE NEGATIVE   Leukocytes, UA NEGATIVE NEGATIVE  Pregnancy, urine     Status: None   Collection Time: 03/04/16  7:40 PM  Result Value Ref Range   Preg Test, Ur NEGATIVE NEGATIVE  Lipase, blood     Status: None   Collection Time: 03/04/16  7:54 PM  Result Value Ref Range   Lipase 24 11 - 51 U/L  Comprehensive metabolic panel     Status: None   Collection Time: 03/04/16  7:54 PM  Result Value Ref Range   Sodium 138 135 - 145 mmol/L  Potassium 4.0 3.5 - 5.1 mmol/L   Chloride 104 101 - 111 mmol/L   CO2 25 22 - 32 mmol/L   Glucose, Bld 86 65 - 99 mg/dL   BUN 14 6 - 20 mg/dL   Creatinine, Ser 1.61 0.44 - 1.00 mg/dL   Calcium 9.2 8.9 - 09.6 mg/dL   Total Protein 7.7 6.5 - 8.1 g/dL   Albumin 4.7 3.5 - 5.0 g/dL   AST 24 15 - 41 U/L   ALT 22 14 - 54 U/L   Alkaline Phosphatase 56 38 - 126 U/L   Total Bilirubin 0.5 0.3 - 1.2 mg/dL   GFR calc non Af Amer >60 >60 mL/min   GFR calc Af Amer >60 >60 mL/min   Anion gap 9 5 - 15  CBC with Differential/Platelet     Status: Abnormal   Collection Time: 03/04/16  7:54 PM  Result Value Ref Range   WBC 16.1 (H) 4.0 - 10.5 K/uL   RBC 4.78 3.87 - 5.11 MIL/uL   Hemoglobin 14.2 12.0 - 15.0 g/dL   HCT 04.5 40.9 - 81.1 %   MCV 87.7 78.0 - 100.0 fL   MCH 29.7 26.0 - 34.0 pg   MCHC 33.9 30.0 - 36.0 g/dL   RDW 91.4 78.2 - 95.6 %    Platelets 246 150 - 400 K/uL   Neutrophils Relative % 76 %   Neutro Abs 12.2 (H) 1.7 - 7.7 K/uL   Lymphocytes Relative 14 %   Lymphs Abs 2.3 0.7 - 4.0 K/uL   Monocytes Relative 8 %   Monocytes Absolute 1.2 (H) 0.1 - 1.0 K/uL   Eosinophils Relative 2 %   Eosinophils Absolute 0.3 0.0 - 0.7 K/uL   Basophils Relative 0 %   Basophils Absolute 0.0 0.0 - 0.1 K/uL  hCG, quantitative, pregnancy     Status: None   Collection Time: 03/04/16  7:54 PM  Result Value Ref Range   hCG, Beta Chain, Quant, S <1 <5 mIU/mL  Wet prep, genital     Status: Abnormal   Collection Time: 03/04/16 11:30 PM  Result Value Ref Range   Yeast Wet Prep HPF POC NONE SEEN NONE SEEN   Trich, Wet Prep NONE SEEN NONE SEEN   Clue Cells Wet Prep HPF POC PRESENT (A) NONE SEEN   WBC, Wet Prep HPF POC MODERATE (A) NONE SEEN   Sperm NONE SEEN     US Transvaginal Non-ob  03/05/2016  CLINICAL DATA:  Right adnexal mass on CT. Patient with left lower quadrant pain. EXAM: TRANSABDOMINAL AND TRANSVAGINAL ULTRASOUND OF PELVIS DOPPLER ULTRASOUND OF OVARIES TECHNIQUE: Both transabdominal and transvaginal ultrasound examinations of the pelvis were performed. Transabdominal technique was performed for global imaging of the pelvis including uterus, ovaries, adnexal regions, and pelvic cul-de-sac. It was necessary to proceed with endovaginal exam following the transabdominal exam to visualize the uterus, ovaries and adnexa. Color and duplex Doppler ultrasound was utilized to evaluate blood flow to the ovaries. COMPARISON:  CT abdomen/ pelvis earlier this day FINDINGS: Uterus Measurements: 7.7 x 3.8 x 4.9 cm. No fibroids or other mass visualized. Endometrium Thickness: Not well visualized secondary to intrauterine device, measures approximately 6-9 mm. No focal abnormality visualized. Right ovary Measurements: 5.7 x 4.7 x 3.5 cm. Complex avascular structure with both cystic and hyperechoic regions measures 4.1 x 2.5 x 3.2 cm. Blood flow seen to the  ovarian parenchyma. No evidence of pyosalpinx. Left ovary Measurements: 2.7 x 1.2 x 2.4 cm. Normal blood flow. Normal  appearance/no adnexal mass. Pulsed Doppler evaluation of both ovaries demonstrates normal low-resistance arterial and venous waveforms. Other findings No abnormal free fluid.  Trace free fluid is physiologic. IMPRESSION: 1. No ovarian torsion. Complex cystic structure in the right ovary measuring 4.1 cm is likely a hemorrhagic cyst, and correlates with abnormality seen on CT. No evidence of pyosalpinx or tubo-ovarian abscess. Short-interval follow up ultrasound in 6-12 weeks is recommended, preferably during the week following the patient's normal menses. 2. Normal sonographic appearance of the left ovary without torsion. 3. Intrauterine device appropriately positioned in the uterus. Electronically Signed   By: Rubye OaksMelanie  Ehinger M.D.   On: 03/05/2016 03:25   [place] Pelvis Complete  03/05/2016  CLINICAL DATA:  Right adnexal mass on CT. Patient with left lower quadrant pain. EXAM: TRANSABDOMINAL AND TRANSVAGINAL ULTRASOUND OF PELVIS DOPPLER ULTRASOUND OF OVARIES TECHNIQUE: Both transabdominal and transvaginal ultrasound examinations of the pelvis were performed. Transabdominal technique was performed for global imaging of the pelvis including uterus, ovaries, adnexal regions, and pelvic cul-de-sac. It was necessary to proceed with endovaginal exam following the transabdominal exam to visualize the uterus, ovaries and adnexa. Color and duplex Doppler ultrasound was utilized to evaluate blood flow to the ovaries. COMPARISON:  CT abdomen/ pelvis earlier this day FINDINGS: Uterus Measurements: 7.7 x 3.8 x 4.9 cm. No fibroids or other mass visualized. Endometrium Thickness: Not well visualized secondary to intrauterine device, measures approximately 6-9 mm. No focal abnormality visualized. Right ovary Measurements: 5.7 x 4.7 x 3.5 cm. Complex avascular structure with both cystic and hyperechoic regions  measures 4.1 x 2.5 x 3.2 cm. Blood flow seen to the ovarian parenchyma. No evidence of pyosalpinx. Left ovary Measurements: 2.7 x 1.2 x 2.4 cm. Normal blood flow. Normal appearance/no adnexal mass. Pulsed Doppler evaluation of both ovaries demonstrates normal low-resistance arterial and venous waveforms. Other findings No abnormal free fluid.  Trace free fluid is physiologic. IMPRESSION: 1. No ovarian torsion. Complex cystic structure in the right ovary measuring 4.1 cm is likely a hemorrhagic cyst, and correlates with abnormality seen on CT. No evidence of pyosalpinx or tubo-ovarian abscess. Short-interval follow up ultrasound in 6-12 weeks is recommended, preferably during the week following the patient's normal menses. 2. Normal sonographic appearance of the left ovary without torsion. 3. Intrauterine device appropriately positioned in the uterus. Electronically Signed   By: Rubye OaksMelanie  Ehinger M.D.   On: 03/05/2016 03:25   Ct Abdomen Pelvis W Contrast  03/04/2016  CLINICAL DATA:  Acute onset of left upper and lower quadrant abdominal pain, and lower abdominal cramping. Initial encounter. EXAM: CT ABDOMEN AND PELVIS WITH CONTRAST TECHNIQUE: Multidetector CT imaging of the abdomen and pelvis was performed using the standard protocol following bolus administration of intravenous contrast. CONTRAST:  100mL ISOVUE-300 IOPAMIDOL (ISOVUE-300) INJECTION 61% COMPARISON:  Pelvic ultrasound performed 07/23/2015, and CT of the abdomen and pelvis performed 01/02/2013 FINDINGS: The visualized lung bases are clear. The liver and spleen are unremarkable in appearance. The gallbladder is within normal limits. The pancreas and adrenal glands are unremarkable. The kidneys are unremarkable in appearance. There is no evidence of hydronephrosis. No renal or ureteral stones are seen. No perinephric stranding is appreciated. The small bowel is unremarkable in appearance. The stomach is within normal limits. No acute vascular  abnormalities are seen. There is minimal inflammation about the appendix, though this is thought be secondary to the pelvic process. The appendix remains normal in caliber. The colon is unremarkable in appearance. The bladder is mildly distended and grossly unremarkable. The  uterus is unremarkable in appearance, with an intrauterine device noted in expected position. Minimal soft tissue inflammation is seen tracking about the uterus, and soft tissue inflammation is seen more prominently about the adnexa. There is a 4.8 x 3.5 cm cystic structure at the right adnexa, with surrounding soft tissue inflammation, which may reflect a tubo-ovarian abscess or pyosalpinx. Trace associated fluid is seen tracking along the mesentery. No inguinal lymphadenopathy is seen. No acute osseous abnormalities are identified. IMPRESSION: 1. 4.8 x 3.5 cm cystic structure at the right adnexa, with surrounding soft tissue inflammation, which may reflect a tubo-ovarian abscess or pyosalpinx. Trace associated free fluid is seen tracking along the mesentery. 2. Mild soft tissue inflammation tracks about the pelvis. Electronically Signed   By: Roanna Raider M.D.   On: 03/04/2016 22:31   [place] Art/ven Flow Abd Pelv Doppler  03/05/2016  CLINICAL DATA:  Right adnexal mass on CT. Patient with left lower quadrant pain. EXAM: TRANSABDOMINAL AND TRANSVAGINAL ULTRASOUND OF PELVIS DOPPLER ULTRASOUND OF OVARIES TECHNIQUE: Both transabdominal and transvaginal ultrasound examinations of the pelvis were performed. Transabdominal technique was performed for global imaging of the pelvis including uterus, ovaries, adnexal regions, and pelvic cul-de-sac. It was necessary to proceed with endovaginal exam following the transabdominal exam to visualize the uterus, ovaries and adnexa. Color and duplex Doppler ultrasound was utilized to evaluate blood flow to the ovaries. COMPARISON:  CT abdomen/ pelvis earlier this day FINDINGS: Uterus Measurements: 7.7 x 3.8 x  4.9 cm. No fibroids or other mass visualized. Endometrium Thickness: Not well visualized secondary to intrauterine device, measures approximately 6-9 mm. No focal abnormality visualized. Right ovary Measurements: 5.7 x 4.7 x 3.5 cm. Complex avascular structure with both cystic and hyperechoic regions measures 4.1 x 2.5 x 3.2 cm. Blood flow seen to the ovarian parenchyma. No evidence of pyosalpinx. Left ovary Measurements: 2.7 x 1.2 x 2.4 cm. Normal blood flow. Normal appearance/no adnexal mass. Pulsed Doppler evaluation of both ovaries demonstrates normal low-resistance arterial and venous waveforms. Other findings No abnormal free fluid.  Trace free fluid is physiologic. IMPRESSION: 1. No ovarian torsion. Complex cystic structure in the right ovary measuring 4.1 cm is likely a hemorrhagic cyst, and correlates with abnormality seen on CT. No evidence of pyosalpinx or tubo-ovarian abscess. Short-interval follow up ultrasound in 6-12 weeks is recommended, preferably during the week following the patient's normal menses. 2. Normal sonographic appearance of the left ovary without torsion. 3. Intrauterine device appropriately positioned in the uterus. Electronically Signed   By: Rubye Oaks M.D.   On: 03/05/2016 03:25    Assessment/Plan: 20 year old patient admitted with lower abdominal discomfort last p.m. CT and emergency room suspicious for possible pyosalpinx versus abscess [facility] demonstrated only hemorrhagic cyst but due to the fact the patient had an elevated white blood count at 16,000 and had an IUD and being on the 20 years of age I decided to admit her into the hospital for 24 hours of intravenous antibiotic and repeat her CBC and plan discharge home with oral antibiotic with doxycycline 100 mg twice a day for [redacted] weeks along with Flagyl 500 mg twice a day for 2 weeks for possible PID picture along with the apparent small hemorrhagic cyst that she has. GC and  Chlamydia culture pending.  Kristianne Albin H 03/05/2016, 7:55 AM

## 2016-03-05 NOTE — Progress Notes (Signed)
Pain relief effective with IV Toradol without N/V. Pt & mother had reported immediate N/V when pt has taken oral NSAIDS in the past.

## 2016-03-05 NOTE — MAU Provider Note (Signed)
Chief Complaint: Abdominal Pain  First Provider Initiated Contact with Patient 03/05/16 0359      SUBJECTIVE HPI: Ruth Evans is a 20 y.o. G0P0 who was transferred to [facility] from [facility] ED for possible TOA. LLQ pain, N/V x 3 days. Has Mirena in place. Right adnexal mass on CT and purulent cervical discharge seen on exam. See ED note. Pt sent to MAU for pelvic US and to determine POC.   Location: LLQ Quality: ache Severity: 7/10 on pain scale Duration: 3 days Course: worsening Timing: intermittent Modifying factors: No relationship to position, urination or BM. Associated signs and symptoms: Pos for N/V. Neg for fever, chills, vaginal bleeding, vaginal discha  History reviewed. No pertinent past medical history. OB History  Gravida Para Term Preterm AB SAB TAB Ectopic Multiple Living  0                Past Surgical History  Procedure Laterality Date  . Nasal septum surgery    . Wisdom tooth extraction     Social History   Social History  . Marital Status: Single    Spouse Name: N/A  . Number of Children: N/A  . Years of Education: N/A   Occupational History  . Not on file.   Social History Main Topics  . Smoking status: Never Smoker   . Smokeless tobacco: Never Used  . Alcohol Use: No  . Drug Use: Not on file  . Sexual Activity: Not Currently   Other Topics Concern  . Not on file   Social History Narrative   No current facility-administered medications on file prior to encounter.   Current Outpatient Prescriptions on File Prior to Encounter  Medication Sig Dispense Refill  . albuterol (PROVENTIL HFA;VENTOLIN HFA) 108 (90 BASE) MCG/ACT inhaler Inhale 2 puffs into the lungs every 6 (six) hours as needed for wheezing or shortness of breath.    . Biotin 1 MG CAPS Take 1 capsule by mouth daily.    . cetirizine (ZYRTEC) 10 MG tablet Take 10 mg by mouth daily.    . fluticasone (FLONASE) 50 MCG/ACT nasal spray Place into both nostrils daily.    .  Multiple Vitamin (MULTIVITAMIN WITH MINERALS) TABS Take 1 tablet by mouth daily.    Marland Kitchen azithromycin (ZITHROMAX) 250 MG tablet Take 2 tablets today and then 1 daily (Patient not taking: Reported on 03/04/2016) 6 tablet 0  . nystatin-triamcinolone (MYCOLOG II) cream Apply 1 application topically 3 (three) times daily. (Patient not taking: Reported on 03/04/2016) 30 g 2  . tranexamic acid (LYSTEDA) 650 MG TABS tablet Take 2 tablets (1,300 mg total) by mouth 3 (three) times daily. (Patient not taking: Reported on 09/24/2015) 30 tablet 5   Allergies  Allergen Reactions  . Codeine Nausea And Vomiting    Able to take Dilaudid.   . Nsaids Nausea And Vomiting    Vomiting     I have reviewed the past Medical Hx, Surgical Hx, Social Hx, Allergies and Medications.   Review of Systems  Constitutional: Positive for chills. Negative for fever.  Gastrointestinal: Positive for nausea, vomiting and abdominal pain. Negative for diarrhea, constipation and abdominal distention.  Genitourinary: Positive for pelvic pain. Negative for dysuria, vaginal bleeding, vaginal discharge and menstrual problem.  Musculoskeletal: Negative for back pain.    OBJECTIVE Patient Vitals for the past 24 hrs:  BP Temp Temp src Pulse Resp SpO2  03/05/16 0134 (!) 109/52 mmHg 98.1 F (36.7 C) Oral 64 18 99 %  03/05/16 0000 113/62  mmHg - - 73 - 99 %  03/04/16 2322 109/68 mmHg - - 103 18 99 %  03/04/16 2300 109/68 mmHg - - 73 - 100 %  03/04/16 2143 169/88 mmHg - - 88 18 100 %  03/04/16 1949 - - - 87 - 100 %  03/04/16 1948 160/84 mmHg - - - - -  03/04/16 1927 143/93 mmHg 98.7 F (37.1 C) Oral 81 16 100 %   Constitutional: Well-developed, well-nourished female in moderate distress.  Cardiovascular: normal rate  Respiratory: normal rate and effort.  GI: Abd soft, mod RLQ and SP tenderness . Pos BS x 4. Neurologic: Alert and oriented x 4.  GU: Neg CVAT.  SPECULUM EXAM: Deferred  LAB RESULTS Results for orders placed or  performed during the hospital encounter of 03/04/16 (from the past 24 hour(s))  Urinalysis, Routine w reflex microscopic     Status: None   Collection Time: 03/04/16  7:40 PM  Result Value Ref Range   Color, Urine YELLOW YELLOW   APPearance CLEAR CLEAR   Specific Gravity, Urine 1.013 1.005 - 1.030   pH 5.5 5.0 - 8.0   Glucose, UA NEGATIVE NEGATIVE mg/dL   Hgb urine dipstick NEGATIVE NEGATIVE   Bilirubin Urine NEGATIVE NEGATIVE   Ketones, ur NEGATIVE NEGATIVE mg/dL   Protein, ur NEGATIVE NEGATIVE mg/dL   Nitrite NEGATIVE NEGATIVE   Leukocytes, UA NEGATIVE NEGATIVE  Pregnancy, urine     Status: None   Collection Time: 03/04/16  7:40 PM  Result Value Ref Range   Preg Test, Ur NEGATIVE NEGATIVE  Lipase, blood     Status: None   Collection Time: 03/04/16  7:54 PM  Result Value Ref Range   Lipase 24 11 - 51 U/L  Comprehensive metabolic panel     Status: None   Collection Time: 03/04/16  7:54 PM  Result Value Ref Range   Sodium 138 135 - 145 mmol/L   Potassium 4.0 3.5 - 5.1 mmol/L   Chloride 104 101 - 111 mmol/L   CO2 25 22 - 32 mmol/L   Glucose, Bld 86 65 - 99 mg/dL   BUN 14 6 - 20 mg/dL   Creatinine, Ser 1.61 0.44 - 1.00 mg/dL   Calcium 9.2 8.9 - 09.6 mg/dL   Total Protein 7.7 6.5 - 8.1 g/dL   Albumin 4.7 3.5 - 5.0 g/dL   AST 24 15 - 41 U/L   ALT 22 14 - 54 U/L   Alkaline Phosphatase 56 38 - 126 U/L   Total Bilirubin 0.5 0.3 - 1.2 mg/dL   GFR calc non Af Amer >60 >60 mL/min   GFR calc Af Amer >60 >60 mL/min   Anion gap 9 5 - 15  CBC with Differential/Platelet     Status: Abnormal   Collection Time: 03/04/16  7:54 PM  Result Value Ref Range   WBC 16.1 (H) 4.0 - 10.5 K/uL   RBC 4.78 3.87 - 5.11 MIL/uL   Hemoglobin 14.2 12.0 - 15.0 g/dL   HCT 04.5 40.9 - 81.1 %   MCV 87.7 78.0 - 100.0 fL   MCH 29.7 26.0 - 34.0 pg   MCHC 33.9 30.0 - 36.0 g/dL   RDW 91.4 78.2 - 95.6 %   Platelets 246 150 - 400 K/uL   Neutrophils Relative % 76 %   Neutro Abs 12.2 (H) 1.7 - 7.7 K/uL    Lymphocytes Relative 14 %   Lymphs Abs 2.3 0.7 - 4.0 K/uL   Monocytes Relative 8 %  Monocytes Absolute 1.2 (H) 0.1 - 1.0 K/uL   Eosinophils Relative 2 %   Eosinophils Absolute 0.3 0.0 - 0.7 K/uL   Basophils Relative 0 %   Basophils Absolute 0.0 0.0 - 0.1 K/uL  hCG, quantitative, pregnancy     Status: None   Collection Time: 03/04/16  7:54 PM  Result Value Ref Range   hCG, Beta Chain, Quant, S <1 <5 mIU/mL  Wet prep, genital     Status: Abnormal   Collection Time: 03/04/16 11:30 PM  Result Value Ref Range   Yeast Wet Prep HPF POC NONE SEEN NONE SEEN   Trich, Wet Prep NONE SEEN NONE SEEN   Clue Cells Wet Prep HPF POC PRESENT (A) NONE SEEN   WBC, Wet Prep HPF POC MODERATE (A) NONE SEEN   Sperm NONE SEEN     IMAGING [place] Transvaginal Non-ob  03/05/2016  CLINICAL DATA:  Right adnexal mass on CT. Patient with left lower quadrant pain. EXAM: TRANSABDOMINAL AND TRANSVAGINAL ULTRASOUND OF PELVIS DOPPLER ULTRASOUND OF OVARIES TECHNIQUE: Both transabdominal and transvaginal ultrasound examinations of the pelvis were performed. Transabdominal technique was performed for global imaging of the pelvis including uterus, ovaries, adnexal regions, and pelvic cul-de-sac. It was necessary to proceed with endovaginal exam following the transabdominal exam to visualize the uterus, ovaries and adnexa. Color and duplex Doppler ultrasound was utilized to evaluate blood flow to the ovaries. COMPARISON:  CT abdomen/ pelvis earlier this day FINDINGS: Uterus Measurements: 7.7 x 3.8 x 4.9 cm. No fibroids or other mass visualized. Endometrium Thickness: Not well visualized secondary to intrauterine device, measures approximately 6-9 mm. No focal abnormality visualized. Right ovary Measurements: 5.7 x 4.7 x 3.5 cm. Complex avascular structure with both cystic and hyperechoic regions measures 4.1 x 2.5 x 3.2 cm. Blood flow seen to the ovarian parenchyma. No evidence of pyosalpinx. Left ovary Measurements: 2.7 x 1.2 x 2.4 cm.  Normal blood flow. Normal appearance/no adnexal mass. Pulsed Doppler evaluation of both ovaries demonstrates normal low-resistance arterial and venous waveforms. Other findings No abnormal free fluid.  Trace free fluid is physiologic. IMPRESSION: 1. No ovarian torsion. Complex cystic structure in the right ovary measuring 4.1 cm is likely a hemorrhagic cyst, and correlates with abnormality seen on CT. No evidence of pyosalpinx or tubo-ovarian abscess. Short-interval follow up ultrasound in 6-12 weeks is recommended, preferably during the week following the patient's normal menses. 2. Normal sonographic appearance of the left ovary without torsion. 3. Intrauterine device appropriately positioned in the uterus. Electronically Signed   By: Rubye OaksMelanie  Ehinger M.D.   On: 03/05/2016 03:25   [place] Pelvis Complete  03/05/2016  CLINICAL DATA:  Right adnexal mass on CT. Patient with left lower quadrant pain. EXAM: TRANSABDOMINAL AND TRANSVAGINAL ULTRASOUND OF PELVIS DOPPLER ULTRASOUND OF OVARIES TECHNIQUE: Both transabdominal and transvaginal ultrasound examinations of the pelvis were performed. Transabdominal technique was performed for global imaging of the pelvis including uterus, ovaries, adnexal regions, and pelvic cul-de-sac. It was necessary to proceed with endovaginal exam following the transabdominal exam to visualize the uterus, ovaries and adnexa. Color and duplex Doppler ultrasound was utilized to evaluate blood flow to the ovaries. COMPARISON:  CT abdomen/ pelvis earlier this day FINDINGS: Uterus Measurements: 7.7 x 3.8 x 4.9 cm. No fibroids or other mass visualized. Endometrium Thickness: Not well visualized secondary to intrauterine device, measures approximately 6-9 mm. No focal abnormality visualized. Right ovary Measurements: 5.7 x 4.7 x 3.5 cm. Complex avascular structure with both cystic and hyperechoic regions measures 4.1 x 2.5  x 3.2 cm. Blood flow seen to the ovarian parenchyma. No evidence of pyosalpinx.  Left ovary Measurements: 2.7 x 1.2 x 2.4 cm. Normal blood flow. Normal appearance/no adnexal mass. Pulsed Doppler evaluation of both ovaries demonstrates normal low-resistance arterial and venous waveforms. Other findings No abnormal free fluid.  Trace free fluid is physiologic. IMPRESSION: 1. No ovarian torsion. Complex cystic structure in the right ovary measuring 4.1 cm is likely a hemorrhagic cyst, and correlates with abnormality seen on CT. No evidence of pyosalpinx or tubo-ovarian abscess. Short-interval follow up ultrasound in 6-12 weeks is recommended, preferably during the week following the patient's normal menses. 2. Normal sonographic appearance of the left ovary without torsion. 3. Intrauterine device appropriately positioned in the uterus. Electronically Signed   By: Rubye Oaks M.D.   On: 03/05/2016 03:25   Ct Abdomen Pelvis W Contrast  03/04/2016  CLINICAL DATA:  Acute onset of left upper and lower quadrant abdominal pain, and lower abdominal cramping. Initial encounter. EXAM: CT ABDOMEN AND PELVIS WITH CONTRAST TECHNIQUE: Multidetector CT imaging of the abdomen and pelvis was performed using the standard protocol following bolus administration of intravenous contrast. CONTRAST:  ISOVUE-300 IOPAMIDOL (ISOVUE-300) INJECTION 61% COMPARISON:  Pelvic ultrasound performed 07/23/2015, and CT of the abdomen and pelvis performed 01/02/2013 FINDINGS: The visualized lung bases are clear. The liver and spleen are unremarkable in appearance. The gallbladder is within normal limits. The pancreas and adrenal glands are unremarkable. The kidneys are unremarkable in appearance. There is no evidence of hydronephrosis. No renal or ureteral stones are seen. No perinephric stranding is appreciated. The small bowel is unremarkable in appearance. The stomach is within normal limits. No acute vascular abnormalities are seen. There is minimal inflammation about the appendix, though this is thought be secondary  to the pelvic process. The appendix remains normal in caliber. The colon is unremarkable in appearance. The bladder is mildly distended and grossly unremarkable. The uterus is unremarkable in appearance, with an intrauterine device noted in expected position. Minimal soft tissue inflammation is seen tracking about the uterus, and soft tissue inflammation is seen more prominently about the adnexa. There is a 4.8 x 3.5 cm cystic structure at the right adnexa, with surrounding soft tissue inflammation, which may reflect a tubo-ovarian abscess or pyosalpinx. Trace associated fluid is seen tracking along the mesentery. No inguinal lymphadenopathy is seen. No acute osseous abnormalities are identified. IMPRESSION: 1. 4.8 x 3.5 cm cystic structure at the right adnexa, with surrounding soft tissue inflammation, which may reflect a tubo-ovarian abscess or pyosalpinx. Trace associated free fluid is seen tracking along the mesentery. 2. Mild soft tissue inflammation tracks about the pelvis. Electronically Signed   By: Roanna Raider M.D.   On: 03/04/2016 22:31   [place] Art/ven Flow Abd Pelv Doppler  03/05/2016  CLINICAL DATA:  Right adnexal mass on CT. Patient with left lower quadrant pain. EXAM: TRANSABDOMINAL AND TRANSVAGINAL ULTRASOUND OF PELVIS DOPPLER ULTRASOUND OF OVARIES TECHNIQUE: Both transabdominal and transvaginal ultrasound examinations of the pelvis were performed. Transabdominal technique was performed for global imaging of the pelvis including uterus, ovaries, adnexal regions, and pelvic cul-de-sac. It was necessary to proceed with endovaginal exam following the transabdominal exam to visualize the uterus, ovaries and adnexa. Color and duplex Doppler ultrasound was utilized to evaluate blood flow to the ovaries. COMPARISON:  CT abdomen/ pelvis earlier this day FINDINGS: Uterus Measurements: 7.7 x 3.8 x 4.9 cm. No fibroids or other mass visualized. Endometrium Thickness: Not well visualized secondary to  intrauterine device,  measures approximately 6-9 mm. No focal abnormality visualized. Right ovary Measurements: 5.7 x 4.7 x 3.5 cm. Complex avascular structure with both cystic and hyperechoic regions measures 4.1 x 2.5 x 3.2 cm. Blood flow seen to the ovarian parenchyma. No evidence of pyosalpinx. Left ovary Measurements: 2.7 x 1.2 x 2.4 cm. Normal blood flow. Normal appearance/no adnexal mass. Pulsed Doppler evaluation of both ovaries demonstrates normal low-resistance arterial and venous waveforms. Other findings No abnormal free fluid.  Trace free fluid is physiologic. IMPRESSION: 1. No ovarian torsion. Complex cystic structure in the right ovary measuring 4.1 cm is likely a hemorrhagic cyst, and correlates with abnormality seen on CT. No evidence of pyosalpinx or tubo-ovarian abscess. Short-interval follow up ultrasound in 6-12 weeks is recommended, preferably during the week following the patient's normal menses. 2. Normal sonographic appearance of the left ovary without torsion. 3. Intrauterine device appropriately positioned in the uterus. Electronically Signed   By: Rubye Oaks M.D.   On: 03/05/2016 03:25    MAU COURSE Pelvic US w/ dopplers, dilaudid.  MDM - [place] does not show TOA. Shows possible hemorrhagic Cyst. - Dx PID based CMT, leukocytosis and mucopurulent discharge.  ASSESSMENT 1. PID (acute pelvic inflammatory disease)   2. Abdominal pain in female   3. Hemorrhagic cyst of ovary     PLAN Admit to Women's Unit per consult w/ Dr. Lily Peer Cefotetan, Doxy and Flagyl   Dorathy Kinsman, CNM 03/05/2016  5:01 AM

## 2016-03-05 NOTE — ED Notes (Signed)
[facility]

## 2016-03-06 LAB — CBC
HEMATOCRIT: 38.9 % (ref 36.0–46.0)
HEMOGLOBIN: 12.9 g/dL (ref 12.0–15.0)
MCH: 29.9 pg (ref 26.0–34.0)
MCHC: 33.2 g/dL (ref 30.0–36.0)
MCV: 90.3 fL (ref 78.0–100.0)
Platelets: 233 10*3/uL (ref 150–400)
RBC: 4.31 MIL/uL (ref 3.87–5.11)
RDW: 12.9 % (ref 11.5–15.5)
WBC: 9.5 10*3/uL (ref 4.0–10.5)

## 2016-03-06 MED ORDER — TRAMADOL HCL 50 MG PO TABS: 50.0000 mg | ORAL_TABLET | Freq: Four times a day (QID) | ORAL | Status: DC | PRN

## 2016-03-06 MED ORDER — DOXYCYCLINE HYCLATE 100 MG PO CAPS: 100.0000 mg | ORAL_CAPSULE | Freq: Two times a day (BID) | ORAL | Status: DC

## 2016-03-06 NOTE — Discharge Instructions (Signed)

## 2016-03-06 NOTE — Discharge Summary (Signed)
Physician Discharge Summary  Patient ID: Ruth Evans MRN: [id number] DOB/AGE: [birth year] 20 y.o.  Admit date: 03/04/2016 Discharge date: 03/06/2016  Admission Diagnoses: Pelvic inflammatory disease  Discharge Diagnoses:  Pelvic inflammatory disease. Hemorrhagic right ovarian cyst. Active Problems:   PID (acute pelvic inflammatory disease)   Discharged Condition: good  Hospital Course:  The patient is a 20 year old Caucasian female admitted with pelvic pain and bleeding in the presence of a Mirena IUD.  Her diagnosis was pelvic inflammatory disease.  CT scan indicated a possible right pyosalpinx which on pelvic ultrasound showed a hemorrhagic ovarian cyst.  The patient received IV Cefotetan and IV Doxycycline, followed by oral Doxycycline and Flagyl. Her admission WBC was 16,000.  Her T max was 99.3.  At the time of discharge, the patient's WBC was 9,500, and she was afebrile and improved.   Consults: None  Significant Diagnostic Studies: labs:  WBC 16.1 on admission. Wet prep showed clue cells. GC/CT were both negative.   Treatments: IV hydration and antibiotics: IV Cefotetan and oral Flagyl and Doxycycline.  IV Dilaudid.   Discharge Exam: Blood pressure 108/51, pulse 55, temperature 98.1 F (36.7 C), temperature source Oral, resp. rate 16, height 5\' 7"  (1.702 m), weight 275 lb 8 oz (124.966 kg), last menstrual period 02/19/2016, SpO2 100 %. General: alert and cooperative Resp: clear to auscultation bilaterally Cardio: regular rate and rhythm, S1, S2 normal, no murmur, click, rub or gallop GI: Soft, mildly tender throughout without guarding or rebound, no masses.   Disposition: 01-Home or Self Care   Instructions reviewed in verbal and written form.      Medication List    STOP taking these medications        azithromycin 250 MG tablet  Commonly known as:  ZITHROMAX     ibuprofen 200 MG tablet  Commonly known as:  ADVIL,MOTRIN     tranexamic acid 650 MG Tabs tablet   Commonly known as:  LYSTEDA      TAKE these medications        albuterol 108 (90 Base) MCG/ACT inhaler  Commonly known as:  PROVENTIL HFA;VENTOLIN HFA  Inhale 2 puffs into the lungs every 6 (six) hours as needed for wheezing or shortness of breath.     Biotin 1 MG Caps  Take 1 capsule by mouth daily.     cetirizine 10 MG tablet  Commonly known as:  ZYRTEC  Take 10 mg by mouth daily.     doxycycline 100 MG capsule  Commonly known as:  VIBRAMYCIN  Take 1 capsule (100 mg total) by mouth 2 (two) times daily. Take BID for 14 days.  Take with food as can cause GI distress.     fluconazole 150 MG tablet  Commonly known as:  DIFLUCAN  Take 1 tablet (150 mg total) by mouth once. Repeat in 72 hours     fluticasone 50 MCG/ACT nasal spray  Commonly known as:  FLONASE  Place into both nostrils daily.     metroNIDAZOLE 500 MG tablet  Commonly known as:  FLAGYL  Take 1 tablet by mouth twice a day for 2 weeks     multivitamin with minerals Tabs tablet  Take 1 tablet by mouth daily.     nystatin-triamcinolone cream  Commonly known as:  MYCOLOG II  Apply 1 application topically 3 (three) times daily.     traMADol 50 MG tablet  Commonly known as:  ULTRAM  Take 1 tablet (50 mg total) by mouth every 6 (six)  hours as needed. Can take 2 tablets every 6 hours for a max dose of /day, if needed.       Follow up with Dr. Lily Peer in 2 weeks, sooner as needed.  Signed: Melony Overly 03/06/2016, 9:30 AM

## 2016-03-06 NOTE — Progress Notes (Signed)
Hospital Day #2 Pelvic Inflammatory Disease.  Right hemorrhagia ovarian cyst. Coverage for Dr. Lily PeerFernandez.  Subjective: Patient reports abdominal pain and cramping is less than when she was admitted.  Took Dilaudid last night.  Tolerating regular food without nausea or vomiting.  No bowel movement.  Vaginal bleeding is very light.   Objective: I have reviewed patient's vital signs, intake and output and labs. T Max 99.3, T now 98.1, BP 108/51, P 55, RR 16. I/O - 1990 cc/1875 cc. WBC 9.5, Hgb 12.9 this am.   General: alert and cooperative Resp: clear to auscultation bilaterally Cardio: regular rate and rhythm, S1, S2 normal, no murmur, click, rub or gallop GI: Soft, mildly tender throughout without guarding or rebound, no masses.   Assessment: Pelvic Inflammatory Disease. Status post removal of Mirena IUD.  Day number two Cefotetan, Doxycycline, and Flagyl. Improved.  WBC normal and no significant fever.  Plan: Last dose of Cefotetan at 10:00 today.  Doxycycline due to 12:00 today.  Discharge home after noon dose of oral Doxycycline and Flagyl. Will complete a 2 week course of Doxycycline and Flagyl.   Tramadol prn pain.  Instructions and precautions given.  Abstain from intercourse.  Follow up with Dr. Lily PeerFernandez in two week for recheck and pelvic ultrasound.  Patient will call the office on Tuesday after [facility] Day to schedule this.     LOS: 1 day    Ruth Evans 03/06/2016, 9:11 AM

## 2016-03-10 ENCOUNTER — Other Ambulatory Visit: Payer: Self-pay | Admitting: Gynecology

## 2016-03-10 DIAGNOSIS — N730 Acute parametritis and pelvic cellulitis: Secondary | ICD-10-CM

## 2016-03-13 ENCOUNTER — Encounter (HOSPITAL_COMMUNITY): Payer: Self-pay | Admitting: Advanced Practice Midwife

## 2016-03-15 ENCOUNTER — Other Ambulatory Visit: Payer: Federal, State, Local not specified - PPO

## 2016-03-15 ENCOUNTER — Ambulatory Visit: Payer: Federal, State, Local not specified - PPO | Admitting: Gynecology

## 2016-03-19 ENCOUNTER — Ambulatory Visit: Payer: Federal, State, Local not specified - PPO | Admitting: Gynecology

## 2016-03-22 ENCOUNTER — Ambulatory Visit (INDEPENDENT_AMBULATORY_CARE_PROVIDER_SITE_OTHER): Payer: Federal, State, Local not specified - PPO

## 2016-03-22 ENCOUNTER — Encounter: Payer: Self-pay | Admitting: Gynecology

## 2016-03-22 ENCOUNTER — Other Ambulatory Visit: Payer: Self-pay | Admitting: Gynecology

## 2016-03-22 ENCOUNTER — Ambulatory Visit (INDEPENDENT_AMBULATORY_CARE_PROVIDER_SITE_OTHER): Payer: Federal, State, Local not specified - PPO | Admitting: Gynecology

## 2016-03-22 VITALS — BP 124/82

## 2016-03-22 DIAGNOSIS — N730 Acute parametritis and pelvic cellulitis: Secondary | ICD-10-CM

## 2016-03-22 DIAGNOSIS — N839 Noninflammatory disorder of ovary, fallopian tube and broad ligament, unspecified: Secondary | ICD-10-CM

## 2016-03-22 DIAGNOSIS — N83201 Unspecified ovarian cyst, right side: Secondary | ICD-10-CM

## 2016-03-22 DIAGNOSIS — N838 Other noninflammatory disorders of ovary, fallopian tube and broad ligament: Secondary | ICD-10-CM

## 2016-03-22 DIAGNOSIS — N83202 Unspecified ovarian cyst, left side: Secondary | ICD-10-CM | POA: Insufficient documentation

## 2016-03-22 MED ORDER — MEDROXYPROGESTERONE ACETATE 150 MG/ML IM SUSP
150.0000 mg | Freq: Once | INTRAMUSCULAR | Status: AC
  Administered 2016-03-22: 150 mg via INTRAMUSCULAR

## 2016-03-22 NOTE — Addendum Note (Signed)
Addended by: Berna SpareASTILLO, Xoe Hoe A on: 03/22/2016 02:11 PM   Modules accepted: Orders

## 2016-03-22 NOTE — Progress Notes (Signed)
HPI: Patient is a 20 year old who was admitted to the hospital on May 26 for suspected PID/TOA. Her Mirena IUD was removed and she was treated for suspected PID with IV cefotetan and IVs doxycycline and discharge home on doxycycline and Flagyl which she took for 2 weeks. Her admitting white blood count been 16,000 and after 24 hours of antibiotics her white blood count decreased to 9500. She was here for follow-up ultrasound she was otherwise asymptomatic  When she was seen in the emergency room back on May 25 she had a CT and ultrasound which demonstrated the following:  CT of pelvis: IMPRESSION: 1. 4.8 x 3.5 cm cystic structure at the right adnexa, with surrounding soft tissue inflammation, which may reflect a tubo-ovarian abscess or pyosalpinx. Trace associated free fluid is seen tracking along the mesentery. 2. Mild soft tissue inflammation tracks about the pelvis.  Pelvic ultrasound: IMPRESSION: 1. No ovarian torsion. Complex cystic structure in the right ovary measuring 4.1 cm is likely a hemorrhagic cyst, and correlates with abnormality seen on CT. No evidence of pyosalpinx or tubo-ovarian abscess. Short-interval follow up ultrasound in 6-12 weeks is recommended, preferably during the week following the patient's normal menses. 2. Normal sonographic appearance of the left ovary without torsion. 3. Intrauterine device appropriately positioned in the uterus.  In the emergency room her IUD was removed.    ROS: A ROS was performed and pertinent positives and negatives are included in the history.  GENERAL: No fevers or chills. HEENT: No change in vision, no earache, sore throat or sinus congestion. NECK: No pain or stiffness. CARDIOVASCULAR: No chest pain or pressure. No palpitations. PULMONARY: No shortness of breath, cough or wheeze. GASTROINTESTINAL: No abdominal pain, nausea, vomiting or diarrhea, melena or bright red blood per rectum. GENITOURINARY: No urinary frequency,  urgency, hesitancy or dysuria. MUSCULOSKELETAL: No joint or muscle pain, no back pain, no recent trauma. DERMATOLOGIC: No rash, no itching, no lesions. ENDOCRINE: No polyuria, polydipsia, no heat or cold intolerance. No recent change in weight. HEMATOLOGICAL: No anemia or easy bruising or bleeding. NEUROLOGIC: No headache, seizures, numbness, tingling or weakness. PSYCHIATRIC: No depression, no loss of interest in normal activity or change in sleep pattern.   PE: Well developed well nourished female in no acute distress Abdomen: Soft nontender no rebound or guarding Pelvic: Not examined ultrasound see below Back: No CVA tenderness Extremities no cords or edema  Ultrasound: Uterus measured 8.2 x 4.8 x 3.7 cm with endometrial stripe of 13.5 mm. Patient on day 30th of cycle. Right ovary blood flow seen with a thinwall cyst/solid mass avascular irregular border low level internal low level echoes measuring 4.5 x 3.4 x 4.9 cm average size 4.3 cm. There was some fluid in the cul-de-sac near the left adnexa measuring 19 x 36 x 17 mm. Left ovary was normal.   Assessment Plan: Patient with right hemorrhagic cysts had been hospitalized few weeks ago for suspected PID type picture the Mirena IUD was removed. She will receive Depo-Provera 150 mg IM today and will return back in 3 months for follow-up to see the been resolution of this hemorrhagic cyst. She was asymptomatic today. If her symptoms return or this is good spirit time of the ultrasound in 3 months we will plan on laparoscopic cystectomy. She is interested in the Nexplanon. Literature information was provided we will reassess after the ultrasound in 3 months.    Greater than 50% of time was spent in counseling and coordinating care of this patient.  Time of consultation: 15   Minutes.

## 2016-03-22 NOTE — Patient Instructions (Signed)
Etonogestrel implant What is this medicine? ETONOGESTREL (et oh noe JES trel) is a contraceptive (birth control) device. It is used to prevent pregnancy. It can be used for up to 3 years. This medicine may be used for other purposes; ask your health care provider or pharmacist if you have questions. What should I tell my health care provider before I take this medicine? They need to know if you have any of these conditions: -abnormal vaginal bleeding -blood vessel disease or blood clots -cancer of the breast, cervix, or liver -depression -diabetes -gallbladder disease -headaches -heart disease or recent heart attack -high blood pressure -high cholesterol -kidney disease -liver disease -renal disease -seizures -tobacco smoker -an unusual or allergic reaction to etonogestrel, other hormones, anesthetics or antiseptics, medicines, foods, dyes, or preservatives -pregnant or trying to get pregnant -breast-feeding How should I use this medicine? This device is inserted just under the skin on the inner side of your upper arm by a health care professional. Talk to your pediatrician regarding the use of this medicine in children. Special care may be needed. Overdosage: If you think you have taken too much of this medicine contact a poison control center or emergency room at once. NOTE: This medicine is only for you. Do not share this medicine with others. What if I miss a dose? This does not apply. What may interact with this medicine? Do not take this medicine with any of the following medications: -amprenavir -bosentan -fosamprenavir This medicine may also interact with the following medications: -barbiturate medicines for inducing sleep or treating seizures -certain medicines for fungal infections like ketoconazole and itraconazole -griseofulvin -medicines to treat seizures like carbamazepine, felbamate, oxcarbazepine, phenytoin,  topiramate -modafinil -phenylbutazone -rifampin -some medicines to treat HIV infection like atazanavir, indinavir, lopinavir, nelfinavir, tipranavir, ritonavir -St. John's wort This list may not describe all possible interactions. Give your health care provider a list of all the medicines, herbs, non-prescription drugs, or dietary supplements you use. Also tell them if you smoke, drink alcohol, or use illegal drugs. Some items may interact with your medicine. What should I watch for while using this medicine? This product does not protect you against HIV infection (AIDS) or other sexually transmitted diseases. You should be able to feel the implant by pressing your fingertips over the skin where it was inserted. Contact your doctor if you cannot feel the implant, and use a non-hormonal birth control method (such as condoms) until your doctor confirms that the implant is in place. If you feel that the implant may have broken or become bent while in your arm, contact your healthcare provider. What side effects may I notice from receiving this medicine? Side effects that you should report to your doctor or health care professional as soon as possible: -allergic reactions like skin rash, itching or hives, swelling of the face, lips, or tongue -breast lumps -changes in emotions or moods -depressed mood -heavy or prolonged menstrual bleeding -pain, irritation, swelling, or bruising at the insertion site -scar at site of insertion -signs of infection at the insertion site such as fever, and skin redness, pain or discharge -signs of pregnancy -signs and symptoms of a blood clot such as breathing problems; changes in vision; chest pain; severe, sudden headache; pain, swelling, warmth in the leg; trouble speaking; sudden numbness or weakness of the face, arm or leg -signs and symptoms of liver injury like dark yellow or brown urine; general ill feeling or flu-like symptoms; light-colored stools; loss of  appetite; nausea; right upper belly   pain; unusually weak or tired; yellowing of the eyes or skin -unusual vaginal bleeding, discharge -signs and symptoms of a stroke like changes in vision; confusion; trouble speaking or understanding; severe headaches; sudden numbness or weakness of the face, arm or leg; trouble walking; dizziness; loss of balance or coordination Side effects that usually do not require medical attention (Report these to your doctor or health care professional if they continue or are bothersome.): -acne -back pain -breast pain -changes in weight -dizziness -general ill feeling or flu-like symptoms -headache -irregular menstrual bleeding -nausea -sore throat -vaginal irritation or inflammation This list may not describe all possible side effects. Call your doctor for medical advice about side effects. You may report side effects to FDA at 1-800-FDA-1088. Where should I keep my medicine? This drug is given in a hospital or clinic and will not be stored at home. NOTE: This sheet is a summary. It may not cover all possible information. If you have questions about this medicine, talk to your doctor, pharmacist, or health care provider.    2016, Elsevier/Gold Standard. (2014-07-12 14:07:06) Ovarian Cyst An ovarian cyst is a fluid-filled sac that forms on an ovary. The ovaries are small organs that produce eggs in women. Various types of cysts can form on the ovaries. Most are not cancerous. Many do not cause problems, and they often go away on their own. Some may cause symptoms and require treatment. Common types of ovarian cysts include:  Functional cysts--These cysts may occur every month during the menstrual cycle. This is normal. The cysts usually go away with the next menstrual cycle if the woman does not get pregnant. Usually, there are no symptoms with a functional cyst.  Endometrioma cysts--These cysts form from the tissue that lines the uterus. They are also called  "chocolate cysts" because they become filled with blood that turns brown. This type of cyst can cause pain in the lower abdomen during intercourse and with your menstrual period.  Cystadenoma cysts--This type develops from the cells on the outside of the ovary. These cysts can get very big and cause lower abdomen pain and pain with intercourse. This type of cyst can twist on itself, cut off its blood supply, and cause severe pain. It can also easily rupture and cause a lot of pain.  Dermoid cysts--This type of cyst is sometimes found in both ovaries. These cysts may contain different kinds of body tissue, such as skin, teeth, hair, or cartilage. They usually do not cause symptoms unless they get very big.  Theca lutein cysts--These cysts occur when too much of a certain hormone (human chorionic gonadotropin) is produced and overstimulates the ovaries to produce an egg. This is most common after procedures used to assist with the conception of a baby (in vitro fertilization). CAUSES   Fertility drugs can cause a condition in which multiple large cysts are formed on the ovaries. This is called ovarian hyperstimulation syndrome.  A condition called polycystic ovary syndrome can cause hormonal imbalances that can lead to nonfunctional ovarian cysts. SIGNS AND SYMPTOMS  Many ovarian cysts do not cause symptoms. If symptoms are present, they may include:  Pelvic pain or pressure.  Pain in the lower abdomen.  Pain during sexual intercourse.  Increasing girth (swelling) of the abdomen.  Abnormal menstrual periods.  Increasing pain with menstrual periods.  Stopping having menstrual periods without being pregnant. DIAGNOSIS  These cysts are commonly found during a routine or annual pelvic exam. Tests may be ordered to find out more  about the cyst. These tests may include:  Ultrasound.  X-ray of the pelvis.  CT scan.  MRI.  Blood tests. TREATMENT  Many ovarian cysts go away on their own  without treatment. Your health care provider may want to check your cyst regularly for 2-3 months to see if it changes. For women in menopause, it is particularly important to monitor a cyst closely because of the higher rate of ovarian cancer in menopausal women. When treatment is needed, it may include any of the following:  A procedure to drain the cyst (aspiration). This may be done using a long needle and ultrasound. It can also be done through a laparoscopic procedure. This involves using a thin, lighted tube with a tiny camera on the end (laparoscope) inserted through a small incision.  Surgery to remove the whole cyst. This may be done using laparoscopic surgery or an open surgery involving a larger incision in the lower abdomen.  Hormone treatment or birth control pills. These methods are sometimes used to help dissolve a cyst. HOME CARE INSTRUCTIONS   Only take over-the-counter or prescription medicines as directed by your health care provider.  Follow up with your health care provider as directed.  Get regular pelvic exams and Pap tests. SEEK MEDICAL CARE IF:   Your periods are late, irregular, or painful, or they stop.  Your pelvic pain or abdominal pain does not go away.  Your abdomen becomes larger or swollen.  You have pressure on your bladder or trouble emptying your bladder completely.  You have pain during sexual intercourse.  You have feelings of fullness, pressure, or discomfort in your stomach.  You lose weight for no apparent reason.  You feel generally ill.  You become constipated.  You lose your appetite.  You develop acne.  You have an increase in body and facial hair.  You are gaining weight, without changing your exercise and eating habits.  You think you are pregnant. SEEK IMMEDIATE MEDICAL CARE IF:   You have increasing abdominal pain.  You feel sick to your stomach (nauseous), and you throw up (vomit).  You develop a fever that comes on  suddenly.  You have abdominal pain during a bowel movement.  Your menstrual periods become heavier than usual. MAKE SURE YOU:  Understand these instructions.  Will watch your condition.  Will get help right away if you are not doing well or get worse.   This information is not intended to replace advice given to you by your health care provider. Make sure you discuss any questions you have with your health care provider.   Document Released: 09/27/2005 Document Revised: 10/02/2013 Document Reviewed: 06/04/2013 Elsevier Interactive Patient Education [employer]2016 Elsevier Inc.

## 2016-03-23 ENCOUNTER — Telehealth: Payer: Self-pay | Admitting: Gynecology

## 2016-03-23 NOTE — Telephone Encounter (Signed)
03/23/16-[facility] for [facility]%, no copay. Per [email or web address] [id number]

## 2016-05-17 ENCOUNTER — Encounter: Payer: Federal, State, Local not specified - PPO | Admitting: Gynecology

## 2016-06-15 ENCOUNTER — Encounter: Payer: Self-pay | Admitting: Gynecology

## 2016-06-15 ENCOUNTER — Ambulatory Visit (INDEPENDENT_AMBULATORY_CARE_PROVIDER_SITE_OTHER): Payer: Federal, State, Local not specified - PPO | Admitting: Gynecology

## 2016-06-15 VITALS — BP 128/84

## 2016-06-15 DIAGNOSIS — N83201 Unspecified ovarian cyst, right side: Secondary | ICD-10-CM | POA: Diagnosis not present

## 2016-06-15 DIAGNOSIS — N938 Other specified abnormal uterine and vaginal bleeding: Secondary | ICD-10-CM | POA: Diagnosis not present

## 2016-06-15 LAB — CBC WITH DIFFERENTIAL/PLATELET
BASOS PCT: 0 %
Basophils Absolute: 0 cells/uL (ref 0–200)
EOS PCT: 5 %
Eosinophils Absolute: 385 cells/uL (ref 15–500)
HCT: 41.9 % (ref 35.0–45.0)
Hemoglobin: 14.1 g/dL (ref 11.7–15.5)
LYMPHS PCT: 35 %
Lymphs Abs: 2695 cells/uL (ref 850–3900)
MCH: 29.7 pg (ref 27.0–33.0)
MCHC: 33.7 g/dL (ref 32.0–36.0)
MCV: 88.4 fL (ref 80.0–100.0)
MONO ABS: 693 {cells}/uL (ref 200–950)
MONOS PCT: 9 %
MPV: 10.7 fL (ref 7.5–12.5)
NEUTROS PCT: 51 %
Neutro Abs: 3927 cells/uL (ref 1500–7800)
PLATELETS: 260 10*3/uL (ref 140–400)
RBC: 4.74 MIL/uL (ref 3.80–5.10)
RDW: 13.6 % (ref 11.0–15.0)
WBC: 7.7 10*3/uL (ref 3.8–10.8)

## 2016-06-15 MED ORDER — MEGESTROL ACETATE 40 MG PO TABS: 40.0000 mg | ORAL_TABLET | Freq: Two times a day (BID) | ORAL | 1 refills | Status: DC

## 2016-06-15 NOTE — Progress Notes (Signed)
   Patient is a 20 year old who was scheduled to have an Nexplanon placed on her arm today for contraception. Review of her record indicated she was in the emergency room approximate 3 months ago for suspected PID/TOA. Her history is as follows:  On May 26 patient been admitted to the hospital for suspected PID/TOA. Her Mirena IUD was removed and she was treated for suspected PID with IV cefotetan and IVs doxycycline and discharge home on doxycycline and Flagyl which she took for 2 weeks. Her admitting white blood count been 16,000 and after 24 hours of antibiotics her white blood count decreased to 9500. She was here for follow-up ultrasound she was otherwise asymptomatic  When she was seen in the emergency room back on May 25 she had a CT and ultrasound which demonstrated the following:  CT of pelvis: IMPRESSION: 1. 4.8 x 3.5 cm cystic structure at the right adnexa, with surrounding soft tissue inflammation, which may reflect a tubo-ovarian abscess or pyosalpinx. Trace associated free fluid is seen tracking along the mesentery. 2. Mild soft tissue inflammation tracks about the pelvis.  Pelvic ultrasound: IMPRESSION: 1. No ovarian torsion. Complex cystic structure in the right ovary measuring 4.1 cm is likely a hemorrhagic cyst, and correlates with abnormality seen on CT. No evidence of pyosalpinx or tubo-ovarian abscess. Short-interval follow up ultrasound in 6-12 weeks is recommended, preferably during the week following the patient's normal menses. 2. Normal sonographic appearance of the left ovary without torsion. 3. Intrauterine device appropriately positioned in the uterus.  In the emergency room her IUD was removed.  Ultrasound in the office here on 03/22/2016 demonstrated the following: The uterus measured 8.2 x 4.8 x 3.7 cm with endometrial stripe of 13.5 mm. Right ovary blood flow seen with thinwall cyst/solid mass avascular irregular border low level internal low level  echoes measuring 4.5 x 3.4 x 4.9 cm average size 4.3 cm. There was some fluid in the cul-de-sac near the left adnexa measuring 19 x 36 x 17 mm. Left ovary was normal.  In an effort to decrease the size of this cyst she had received an injection of Depo-Provera 150 mg IM back on 03/22/2016 and was scheduled to return back next week for an ultrasound to confirm resolution of the cyst. Since she has continued to be bleeding on and off since the IUD was removed and she was treated as described above in the hospital we are going to wait until after the ultrasound next week before placing the Nexplanon. Meanwhile she'll be started on Megace 40 mg twice a day for 10 days to stop with the bleeding we'll check her CBC today. We may want to consider sono hysterogram the same time to rule out any intracavitary defect.  Greater than 50% of the time was spent counseling correlating care for this patient. Time of consultation 10 minutes

## 2016-06-15 NOTE — Patient Instructions (Signed)
Ovarian Cyst An ovarian cyst is a fluid-filled sac that forms on an ovary. The ovaries are small organs that produce eggs in women. Various types of cysts can form on the ovaries. Most are not cancerous. Many do not cause problems, and they often go away on their own. Some may cause symptoms and require treatment. Common types of ovarian cysts include:  Functional cysts--These cysts may occur every month during the menstrual cycle. This is normal. The cysts usually go away with the next menstrual cycle if the woman does not get pregnant. Usually, there are no symptoms with a functional cyst.  Endometrioma cysts--These cysts form from the tissue that lines the uterus. They are also called "chocolate cysts" because they become filled with blood that turns brown. This type of cyst can cause pain in the lower abdomen during intercourse and with your menstrual period.  Cystadenoma cysts--This type develops from the cells on the outside of the ovary. These cysts can get very big and cause lower abdomen pain and pain with intercourse. This type of cyst can twist on itself, cut off its blood supply, and cause severe pain. It can also easily rupture and cause a lot of pain.  Dermoid cysts--This type of cyst is sometimes found in both ovaries. These cysts may contain different kinds of body tissue, such as skin, teeth, hair, or cartilage. They usually do not cause symptoms unless they get very big.  Theca lutein cysts--These cysts occur when too much of a certain hormone (human chorionic gonadotropin) is produced and overstimulates the ovaries to produce an egg. This is most common after procedures used to assist with the conception of a baby (in vitro fertilization). CAUSES   Fertility drugs can cause a condition in which multiple large cysts are formed on the ovaries. This is called ovarian hyperstimulation syndrome.  A condition called polycystic ovary syndrome can cause hormonal imbalances that can lead to  nonfunctional ovarian cysts. SIGNS AND SYMPTOMS  Many ovarian cysts do not cause symptoms. If symptoms are present, they may include:  Pelvic pain or pressure.  Pain in the lower abdomen.  Pain during sexual intercourse.  Increasing girth (swelling) of the abdomen.  Abnormal menstrual periods.  Increasing pain with menstrual periods.  Stopping having menstrual periods without being pregnant. DIAGNOSIS  These cysts are commonly found during a routine or annual pelvic exam. Tests may be ordered to find out more about the cyst. These tests may include:  Ultrasound.  X-ray of the pelvis.  CT scan.  MRI.  Blood tests. TREATMENT  Many ovarian cysts go away on their own without treatment. Your health care provider may want to check your cyst regularly for 2-3 months to see if it changes. For women in menopause, it is particularly important to monitor a cyst closely because of the higher rate of ovarian cancer in menopausal women. When treatment is needed, it may include any of the following:  A procedure to drain the cyst (aspiration). This may be done using a long needle and ultrasound. It can also be done through a laparoscopic procedure. This involves using a thin, lighted tube with a tiny camera on the end (laparoscope) inserted through a small incision.  Surgery to remove the whole cyst. This may be done using laparoscopic surgery or an open surgery involving a larger incision in the lower abdomen.  Hormone treatment or birth control pills. These methods are sometimes used to help dissolve a cyst. HOME CARE INSTRUCTIONS   Only take over-the-counter   or prescription medicines as directed by your health care provider.  Follow up with your health care provider as directed.  Get regular pelvic exams and Pap tests. SEEK MEDICAL CARE IF:   Your periods are late, irregular, or painful, or they stop.  Your pelvic pain or abdominal pain does not go away.  Your abdomen becomes  larger or swollen.  You have pressure on your bladder or trouble emptying your bladder completely.  You have pain during sexual intercourse.  You have feelings of fullness, pressure, or discomfort in your stomach.  You lose weight for no apparent reason.  You feel generally ill.  You become constipated.  You lose your appetite.  You develop acne.  You have an increase in body and facial hair.  You are gaining weight, without changing your exercise and eating habits.  You think you are pregnant. SEEK IMMEDIATE MEDICAL CARE IF:   You have increasing abdominal pain.  You feel sick to your stomach (nauseous), and you throw up (vomit).  You develop a fever that comes on suddenly.  You have abdominal pain during a bowel movement.  Your menstrual periods become heavier than usual. MAKE SURE YOU:  Understand these instructions.  Will watch your condition.  Will get help right away if you are not doing well or get worse.   This information is not intended to replace advice given to you by your health care provider. Make sure you discuss any questions you have with your health care provider.   Document Released: 09/27/2005 Document Revised: 10/02/2013 Document Reviewed: 06/04/2013 Elsevier Interactive Patient Education 2016 Elsevier Inc.  

## 2016-06-16 ENCOUNTER — Other Ambulatory Visit: Payer: Self-pay | Admitting: Gynecology

## 2016-06-16 DIAGNOSIS — N938 Other specified abnormal uterine and vaginal bleeding: Secondary | ICD-10-CM

## 2016-06-17 ENCOUNTER — Telehealth: Payer: Self-pay

## 2016-06-17 NOTE — Telephone Encounter (Signed)
I spoke with patient and let her know that $169.00 due for [employer] at visit. It is her 15% co-ins and that is estimated amount due. No prior autho required

## 2016-06-18 ENCOUNTER — Other Ambulatory Visit: Payer: Self-pay | Admitting: Gynecology

## 2016-06-18 DIAGNOSIS — N939 Abnormal uterine and vaginal bleeding, unspecified: Secondary | ICD-10-CM

## 2016-06-23 ENCOUNTER — Other Ambulatory Visit: Payer: Federal, State, Local not specified - PPO

## 2016-06-23 ENCOUNTER — Ambulatory Visit: Payer: Federal, State, Local not specified - PPO | Admitting: Gynecology

## 2016-06-30 ENCOUNTER — Ambulatory Visit (INDEPENDENT_AMBULATORY_CARE_PROVIDER_SITE_OTHER): Payer: Federal, State, Local not specified - PPO

## 2016-06-30 ENCOUNTER — Encounter: Payer: Self-pay | Admitting: Gynecology

## 2016-06-30 ENCOUNTER — Ambulatory Visit (INDEPENDENT_AMBULATORY_CARE_PROVIDER_SITE_OTHER): Payer: Federal, State, Local not specified - PPO | Admitting: Gynecology

## 2016-06-30 DIAGNOSIS — N83202 Unspecified ovarian cyst, left side: Secondary | ICD-10-CM

## 2016-06-30 DIAGNOSIS — N938 Other specified abnormal uterine and vaginal bleeding: Secondary | ICD-10-CM

## 2016-06-30 DIAGNOSIS — N801 Endometriosis of ovary: Secondary | ICD-10-CM | POA: Insufficient documentation

## 2016-06-30 DIAGNOSIS — N80129 Deep endometriosis of ovary, unspecified ovary: Secondary | ICD-10-CM | POA: Insufficient documentation

## 2016-06-30 DIAGNOSIS — N939 Abnormal uterine and vaginal bleeding, unspecified: Secondary | ICD-10-CM | POA: Diagnosis not present

## 2016-06-30 MED ORDER — MEDROXYPROGESTERONE ACETATE 150 MG/ML IM SUSP
150.0000 mg | Freq: Once | INTRAMUSCULAR | Status: AC
  Administered 2016-06-30: 150 mg via INTRAMUSCULAR

## 2016-06-30 NOTE — Progress Notes (Signed)
Patient is a 20 year old that presented to the office today for follow-up on a left ovarian cyst as well as for dysfunction uterine bleeding. She received Depo-Provera injection 3 months ago and this was her follow-up. Her history is as follows:  Review of her record indicated she was in the emergency room approximate 3 months ago for suspected PID/TOA. Her history is as follows:  On May 26 patient been admitted to the hospital for suspected PID/TOA. Her Mirena IUD was removed and she was treated for suspected PID with IV cefotetan and IVs doxycycline and discharge home on doxycycline and Flagyl which she took for 2 weeks. Her admitting white blood count been 16,000 and after 24 hours of antibiotics her white blood count decreased to 9500. She was here for follow-up ultrasound she was otherwise asymptomatic  When she was seen in the emergency room back on May 25 she had a CT and ultrasound which demonstrated the following:  CT of pelvis: IMPRESSION: 1. 4.8 x 3.5 cm cystic structure at the right adnexa, with surrounding soft tissue inflammation, which may reflect a tubo-ovarian abscess or pyosalpinx. Trace associated free fluid is seen tracking along the mesentery. 2. Mild soft tissue inflammation tracks about the pelvis.  Pelvic ultrasound: IMPRESSION: 1. No ovarian torsion. Complex cystic structure in the right ovary measuring 4.1 cm is likely a hemorrhagic cyst, and correlates with abnormality seen on CT. No evidence of pyosalpinx or tubo-ovarian abscess. Short-interval follow up ultrasound in 6-12 weeks is recommended, preferably during the week following the patient's normal menses. 2. Normal sonographic appearance of the left ovary without torsion. 3. Intrauterine device appropriately positioned in the uterus.  In the emergency room her IUD was removed.  Ultrasound in the office here on 03/22/2016 demonstrated the following: The uterus measured 8.2 x 4.8 x 3.7 cm with  endometrial stripe of 13.5 mm. Right ovary blood flow seen with thinwall cyst/solid mass avascular irregular border low level internal low level echoes measuring 4.5 x 3.4 x 4.9 cm average size 4.3 cm. There was some fluid in the cul-de-sac near the left adnexa measuring 19 x 36 x 17 mm. Left ovary was normal.  In an effort to decrease the size of this cyst she had received an injection of Depo-Provera 150 mg IM back on 03/22/2016 and was scheduled to return back next week for an ultrasound to confirm resolution of the cyst. Since she has continued to be bleeding on and off since the IUD was removed and she was treated as described above in the hospital we are going to wait until after the ultrasound next week before placing the Nexplanon. Meanwhile she'll be started on Megace 40 mg twice a day for 10 days to stop with the bleeding we'll check her CBC today. We may want to consider sono hysterogram the same time to rule out any intracavitary defect. Her CBC was normal she's having no bleeding problem currently since the Depo-Provera.  Ultrasound/sono hysterogram today: Uterus measured 5.5 x 5.1 x 3.6 cm endometrial stripe 5.3 mm she reports normal menstrual period September 5. Right ovary there was a thinwall diffusely homogeneous cyst measuring 2.4 x 1.9 x 2.4 cm negative color flow decreased in size from previous ultrasound. Left ovary was normal. The cervix is then cleansed with Betadine solution and a sterile catheter was introduced into the uterine cavity and there was no defect noted.  Assessment/plan: Right ovarian cyst decreased by half that size over the course of 3 months after having received  Depo-Provera 150 mg IM. The appearance highly suspected of this being an endometrioma. I'm going to give her another shot of Depo-Provera today 150 mg IM and have her return back in 3 months. We discussed that we can continue uses as a form of contraception and to help prevent further cyst since the medicine  has been responding. We'll give her that option were placed the Nexplanon after the next ultrasound.

## 2016-06-30 NOTE — Patient Instructions (Signed)
Endometriosis Endometriosis is a condition in which the tissue that lines the uterus (endometrium) grows outside of its normal location. The tissue may grow in many locations close to the uterus, but it commonly grows on the ovaries, fallopian tubes, vagina, or bowel. Because the uterus expels, or sheds, its lining every menstrual cycle, there is bleeding wherever the endometrial tissue is located. This can cause pain because blood is irritating to tissues not normally exposed to it.  CAUSES  The cause of endometriosis is not known.  SIGNS AND SYMPTOMS  Often, there are no symptoms. When symptoms are present, they can vary with the location of the displaced tissue. Various symptoms can occur at different times. Although symptoms occur mainly during a woman's menstrual period, they can also occur midcycle and usually stop with menopause. Some people may go months with no symptoms at all. Symptoms may include:   Back or abdominal pain.   Heavier bleeding during periods.   Pain during intercourse.   Painful bowel movements.   Infertility. DIAGNOSIS  Your health care provider will do a physical exam and ask about your symptoms. Various tests may be done, such as:   Blood tests and urine tests. These are done to help rule out other problems.   Ultrasound. This test is done to look for abnormal tissue.   An X-ray of the lower bowel (barium enema).  Laparoscopy. In this procedure, a thin, lighted tube with a tiny camera on the end (laparoscope) is inserted into your abdomen. This helps your health care provider look for abnormal tissue to confirm the diagnosis. The health care provider may also remove a small piece of tissue (biopsy) from any abnormal tissue found. This tissue sample can then be sent to a lab so it can be looked at under a microscope. TREATMENT  Treatment will vary and may include:   Medicines to relieve pain. Nonsteroidal anti-inflammatory drugs (NSAIDs) are a type of  pain medicine that can help to relieve the pain caused by endometriosis.  Hormonal therapy. When using hormonal therapy, periods are eliminated. This eliminates the monthly exposure to blood by the displaced endometrial tissue.   Surgery. Surgery may sometimes be done to remove the abnormal endometrial tissue. In severe cases, surgery may be done to remove the fallopian tubes, uterus, and ovaries (hysterectomy). HOME CARE INSTRUCTIONS   Take all medicines as directed by your health care provider. Do not take aspirin because it may increase bleeding when you are not on hormonal therapy.   Avoid activities that produce pain, including sexual activity. SEEK MEDICAL CARE IF:  You have pelvic pain before, after, or during your periods.  You have pelvic pain between periods that gets worse during your period.  You have pelvic pain during or after sex.  You have pelvic pain with bowel movements or urination, especially during your period.  You have problems getting pregnant.  You have a fever. SEEK IMMEDIATE MEDICAL CARE IF:   Your pain is severe and is not responding to pain medicine.   You have severe nausea and vomiting, or you cannot keep foods down.   You have pain that is limited to the right lower part of your abdomen.   You have swelling or increasing pain in your abdomen.   You see blood in your stool.  MAKE SURE YOU:   Understand these instructions.  Will watch your condition.  Will get help right away if you are not doing well or get worse.   This information   is not intended to replace advice given to you by your health care provider. Make sure you discuss any questions you have with your health care provider.   Document Released: 09/24/2000 Document Revised: 10/18/2014 Document Reviewed: 05/25/2013 Elsevier Interactive Patient Education 2016 [employer]  

## 2016-09-23 ENCOUNTER — Ambulatory Visit (INDEPENDENT_AMBULATORY_CARE_PROVIDER_SITE_OTHER): Payer: Federal, State, Local not specified - PPO | Admitting: Gynecology

## 2016-09-23 ENCOUNTER — Encounter: Payer: Self-pay | Admitting: Gynecology

## 2016-09-23 ENCOUNTER — Ambulatory Visit (INDEPENDENT_AMBULATORY_CARE_PROVIDER_SITE_OTHER): Payer: Federal, State, Local not specified - PPO

## 2016-09-23 VITALS — BP 132/76

## 2016-09-23 DIAGNOSIS — N801 Endometriosis of ovary: Secondary | ICD-10-CM | POA: Diagnosis not present

## 2016-09-23 DIAGNOSIS — N938 Other specified abnormal uterine and vaginal bleeding: Secondary | ICD-10-CM | POA: Diagnosis not present

## 2016-09-23 DIAGNOSIS — N80129 Deep endometriosis of ovary, unspecified ovary: Secondary | ICD-10-CM

## 2016-09-23 DIAGNOSIS — N83201 Unspecified ovarian cyst, right side: Secondary | ICD-10-CM

## 2016-09-23 DIAGNOSIS — N83202 Unspecified ovarian cyst, left side: Secondary | ICD-10-CM

## 2016-09-23 NOTE — Progress Notes (Signed)
Patient is a 20 year old who presented to the office for her third month follow-up after Depo-Provera for right ovarian cyst that we have been following. She was asymptomatic today she scheduled to have the Nexplanon place her left arm for contraception later this week. Her history is as follows:  Patient with past history of left ovarian cyst as well as dysfunction uterine bleeding. She received Depo-Provera in June 2017 and was seen for a follow-up on 06/30/2016. Review of her record indicated she had been in the emergency room this year for a suspected PID/TOA. Her history is as follows:  On May 26 patient been admitted to the hospital for suspected PID/TOA. Her Mirena IUD was removed and she was treated for suspected PID with IV cefotetan and IVs doxycycline and discharge home on doxycycline and Flagyl which she took for 2 weeks. Her admitting white blood count been 16,000 and after 24 hours of antibiotics her white blood count decreased to 9500. She was here for follow-up ultrasound she was otherwise asymptomatic  When she was seen in the emergency room back on May 25 she had a CT and ultrasound which demonstrated the following:  CT of pelvis: IMPRESSION: 1. 4.8 x 3.5 cm cystic structure at the right adnexa, with surrounding soft tissue inflammation, which may reflect a tubo-ovarian abscess or pyosalpinx. Trace associated free fluid is seen tracking along the mesentery. 2. Mild soft tissue inflammation tracks about the pelvis.  Pelvic ultrasound: IMPRESSION: 1. No ovarian torsion. Complex cystic structure in the right ovary measuring 4.1 cm is likely a hemorrhagic cyst, and correlates with abnormality seen on CT. No evidence of pyosalpinx or tubo-ovarian abscess. Short-interval follow up ultrasound in 6-12 weeks is recommended, preferably during the week following the patient's normal menses. 2. Normal sonographic appearance of the left ovary without torsion. 3. Intrauterine  device appropriately positioned in the uterus.  In the emergency room her IUD was removed.  Ultrasound in the office here on 03/22/2016 demonstrated the following: The uterus measured 8.2 x 4.8 x 3.7 cm with endometrial stripe of 13.5 mm. Right ovary blood flow seen with thinwall cyst/solid mass avascular irregular border low level internal low level echoes measuring 4.5 x 3.4 x 4.9 cm average size 4.3 cm. There was some fluid in the cul-de-sac near the left adnexa measuring 19 x 36 x 17 mm. Left ovary was normal.  Because of her dysfunctional uterine bleeding she was seen in the office 06/30/2016 ultrasound sonohysterogram demonstrated the following: Uterus measured 5.5 x 5.1 x 3.6 cm endometrial stripe 5.3 mm she reports normal menstrual period September 5. Right ovary there was a thinwall diffusely homogeneous cyst measuring 2.4 x 1.9 x 2.4 cm negative color flow decreased in size from previous ultrasound. Left ovary was normal. The cervix is then cleansed with Betadine solution and a sterile catheter was introduced into the uterine cavity and there was no defect noted.  The right ovarian cyst a decrease appetite size over the course of prior 3 months and she received another injection of Depo-Provera 150 mg and was here for follow-up ultrasound.  Today's ultrasound demonstrated uterus measures 7.2 x 4.6 x 3.6 and meters with endometrial stripe of 3.6 mm. Left ovary was normal and no fluid in the cul-de-sac a thinwall cyst with diffuse homogeneous internal low level echoes measuring 2.8 x 1.5 x 2.2 cm average size 2.2 mm negative color flow Doppler.  Assessment/plan complete resolution of left ovarian cyst small endometrioma appearing cyst. Patient asymptomatic. Patient has worn  off the effect of the Depo-Provera and will be returning this week for Nexplanon to be place in her arm and we could follow-up with an ultrasound in 6 months.

## 2016-09-24 ENCOUNTER — Encounter: Payer: Self-pay | Admitting: Gynecology

## 2016-09-24 ENCOUNTER — Other Ambulatory Visit: Payer: Self-pay | Admitting: *Deleted

## 2016-09-24 ENCOUNTER — Ambulatory Visit (INDEPENDENT_AMBULATORY_CARE_PROVIDER_SITE_OTHER): Payer: Federal, State, Local not specified - PPO | Admitting: Gynecology

## 2016-09-24 VITALS — BP 128/86 | Ht 65.0 in | Wt 279.0 lb

## 2016-09-24 DIAGNOSIS — Z01419 Encounter for gynecological examination (general) (routine) without abnormal findings: Secondary | ICD-10-CM

## 2016-09-24 DIAGNOSIS — N83201 Unspecified ovarian cyst, right side: Secondary | ICD-10-CM

## 2016-09-24 NOTE — Progress Notes (Signed)
Ruth Evans 03-May-1996 [id number]   History:    20 y.o.  for annual gyn exam will be scheduling the next few weeks to place the Nexplanon along for contraception and cycle control. Patient was seen yesterday in the office for follow-up on ovarian cysts and ultrasound was done the note stated the following:  "Patient is a 20 year old who presented to the office for her third month follow-up after Depo-Provera for right ovarian cyst that we have been following. She was asymptomatic today she scheduled to have the Nexplanon place her left arm for contraception later this week. Her history is as follows:  Patient with past history of left ovarian cyst as well as dysfunction uterine bleeding. She received Depo-Provera in June 2017 and was seen for a follow-up on 06/30/2016. Review of her record indicated she had been in the emergency room this year for a suspected PID/TOA. Her history is as follows:  On May 26 patient been admitted to the hospital for suspected PID/TOA. Her Mirena IUD was removed and she was treated for suspected PID with IV cefotetan and IVs doxycycline and discharge home on doxycycline and Flagyl which she took for 2 weeks. Her admitting white blood count been 16,000 and after 24 hours of antibiotics her white blood count decreased to 9500. She was here for follow-up ultrasound she was otherwise asymptomatic  When she was seen in the emergency room back on May 25 she had a CT and ultrasound which demonstrated the following:  CT of pelvis: IMPRESSION: 1. 4.8 x 3.5 cm cystic structure at the right adnexa, with surrounding soft tissue inflammation, which may reflect a tubo-ovarian abscess or pyosalpinx. Trace associated free fluid is seen tracking along the mesentery. 2. Mild soft tissue inflammation tracks about the pelvis.  Pelvic ultrasound: IMPRESSION: 1. No ovarian torsion. Complex cystic structure in the right ovary measuring 4.1 cm is likely a hemorrhagic cyst,  and correlates with abnormality seen on CT. No evidence of pyosalpinx or tubo-ovarian abscess. Short-interval follow up ultrasound in 6-12 weeks is recommended, preferably during the week following the patient's normal menses. 2. Normal sonographic appearance of the left ovary without torsion. 3. Intrauterine device appropriately positioned in the uterus.  In the emergency room her IUD was removed.  Ultrasound in the office here on 03/22/2016 demonstrated the following: The uterus measured 8.2 x 4.8 x 3.7 cm with endometrial stripe of 13.5 mm. Right ovary blood flow seen with thinwall cyst/solid mass avascular irregular border low level internal low level echoes measuring 4.5 x 3.4 x 4.9 cm average size 4.3 cm. There was some fluid in the cul-de-sac near the left adnexa measuring 19 x 36 x 17 mm. Left ovary was normal.  Because of her dysfunctional uterine bleeding she was seen in the office 06/30/2016 ultrasound sonohysterogram demonstrated the following: Uterus measured 5.5 x 5.1 x 3.6 cm endometrial stripe 5.3 mm she reports normal menstrual period September 5. Right ovary there was a thinwall diffusely homogeneous cyst measuring 2.4 x 1.9 x 2.4 cm negative color flow decreased in size from previous ultrasound. Left ovary was normal. The cervix is then cleansed with Betadine solution and a sterile catheter was introduced into the uterine cavity and there was no defect noted.  The right ovarian cyst a decrease appetite size over the course of prior 3 months and she received another injection of Depo-Provera 150 mg and was here for follow-up ultrasound.  Today's ultrasound demonstrated uterus measures 7.2 x 4.6 x 3.6 and meters with  endometrial stripe of 3.6 mm. Left ovary was normal and no fluid in the cul-de-sac a thinwall cyst with diffuse homogeneous internal low level echoes measuring 2.8 x 1.5 x 2.2 cm average size 2.2 mm negative color flow Doppler."  Assessment was made of the risks  are bleeding left ovarian cyst small endometrioma. Patient was otherwise asymptomatic. Patient would no prior history of any abnormal Pap smear. Negative GC and Chlamydia culture few months ago here.  Past medical history,surgical history, family history and social history were all reviewed and documented in the EPIC chart.  Gynecologic History Patient's last menstrual period was 09/18/2016. Contraception: Depo-Provera injections Last Pap: 2016. Results were: normal Last mammogram: Not indicated. Results were: Not indicated  Obstetric History OB History  Gravida Para Term Preterm AB Living  0            SAB TAB Ectopic Multiple Live Births                    ROS: A ROS was performed and pertinent positives and negatives are included in the history.  GENERAL: No fevers or chills. HEENT: No change in vision, no earache, sore throat or sinus congestion. NECK: No pain or stiffness. CARDIOVASCULAR: No chest pain or pressure. No palpitations. PULMONARY: No shortness of breath, cough or wheeze. GASTROINTESTINAL: No abdominal pain, nausea, vomiting or diarrhea, melena or bright red blood per rectum. GENITOURINARY: No urinary frequency, urgency, hesitancy or dysuria. MUSCULOSKELETAL: No joint or muscle pain, no back pain, no recent trauma. DERMATOLOGIC: No rash, no itching, no lesions. ENDOCRINE: No polyuria, polydipsia, no heat or cold intolerance. No recent change in weight. HEMATOLOGICAL: No anemia or easy bruising or bleeding. NEUROLOGIC: No headache, seizures, numbness, tingling or weakness. PSYCHIATRIC: No depression, no loss of interest in normal activity or change in sleep pattern.     Exam: chaperone present  BP 128/86   Ht 5\' 5"  (1.651 m)   Wt 279 lb (126.6 kg)   LMP 09/18/2016   BMI 46.43 kg/m   Body mass index is 46.43 kg/m.  General appearance : Well developed well nourished female. No acute distress HEENT: Eyes: no retinal hemorrhage or exudates,  Neck supple, trachea  midline, no carotid bruits, no thyroidmegaly Lungs: Clear to auscultation, no rhonchi or wheezes, or rib retractions  Heart: Regular rate and rhythm, no murmurs or gallops Breast:Examined in sitting and supine position were symmetrical in appearance, no palpable masses or tenderness,  no skin retraction, no nipple inversion, no nipple discharge, no skin discoloration, no axillary or supraclavicular lymphadenopathy Abdomen: no palpable masses or tenderness, no rebound or guarding Extremities: no edema or skin discoloration or tenderness  Pelvic:  Bartholin, Urethra, Skene Glands: Within normal limits             Vagina: No gross lesions or discharge, menstrual blood present third day of cycle  Cervix: No gross lesions or discharge  Uterus  anteverted, normal size, shape and consistency, non-tender and mobile  Adnexa  Without masses or tenderness  Anus and perineum  normal   Rectovaginal  normal sphincter tone without palpated masses or tenderness             Hemoccult not indicated     Assessment/Plan:  20 y.o. female for annual exam blood work drawn by PCP. Flu vaccine done at work. Patient scheduled to return back in a few weeks to have the Nonpitting for contraception. Patient wearing off effect of Depo-Provera. History of small endometrioma stable  Ok EdwardsFERNANDEZ,Starr Urias H MD, 11:18 AM 09/24/2016

## 2016-09-28 ENCOUNTER — Telehealth: Payer: Self-pay

## 2016-09-28 NOTE — Telephone Encounter (Signed)
I spoke with patient and let her know that ins covers her Nexplanon but it applies to ded and co-ins. They have met family ded but have a 85/15 co-ins. Have not met family OOP max. Per Sharrie Rothman her 15% is $195.00.  I called patient and let her know.

## 2016-10-01 ENCOUNTER — Ambulatory Visit (INDEPENDENT_AMBULATORY_CARE_PROVIDER_SITE_OTHER): Payer: Federal, State, Local not specified - PPO | Admitting: Gynecology

## 2016-10-01 ENCOUNTER — Encounter: Payer: Self-pay | Admitting: Gynecology

## 2016-10-01 ENCOUNTER — Encounter: Payer: Self-pay | Admitting: Anesthesiology

## 2016-10-01 VITALS — BP 124/80

## 2016-10-01 DIAGNOSIS — Z30017 Encounter for initial prescription of implantable subdermal contraceptive: Secondary | ICD-10-CM

## 2016-10-01 DIAGNOSIS — Z975 Presence of (intrauterine) contraceptive device: Secondary | ICD-10-CM | POA: Insufficient documentation

## 2016-10-01 NOTE — Progress Notes (Signed)
   Patient 20 year old who presented to the office today for placement of the Nexplanon for contraception and cycle control. She most recent office visit note for detail.                                              Nexplanon Procedure Note (insertion)     The patient was laying on her back with her nondominant left arm flexed at the elbow and externally rotated. The insertion site was identified as the underside of the nondominant upper arm approximately 8 cm from the medial epicondyle of the humerus. 2 marks were made with a sterile marker: The first marked the spot where the Nexplanon  implant was to be inserted, and a second, marked a spot a few centimeters proximal to the first marke to guide the direction of the insertion. The area was cleansed with Betadine solution. The area was anesthetized with 1% lidocaine  (1 cc)  at the area the injection site and underneath the skin along the planned insertion tunnel. The preloaded disposable Nexplanon was removed from its sterile casing.  The applicator was held above the needle at the textured surface area. The transparent protector was removed. With a freehand, the skin was stretched around the insertion site with a thumb and index finger. The skin was then punctured with the tip of the needle angled at 30. The Nexplanon applicator was lowered to a horizontal position. While lifting the skin with the tip of the needle the needle was then slid to its full length. The applicator was kept in sitting position with a needle inserted to its full length. The purple slider was unlocked by pushing it slightly downward. The slider was fully moved back until it stopped. This allowed the implant to be in the final subdermal position and the needle was locked inside the body of the applicator. The applicator was then removed. A Steri-Strip was made over the incision and a Kerlix wrap was placed which patient is to remove tomorrow. No complications patient tolerated  procedure well and was released home with instructions.   [facility]yr[facility] HospitalFERNANDEZ,Chassie Pennix [id number]:24 PMTD@

## 2017-02-23 ENCOUNTER — Encounter: Payer: Self-pay | Admitting: Gynecology

## 2017-03-16 ENCOUNTER — Ambulatory Visit (INDEPENDENT_AMBULATORY_CARE_PROVIDER_SITE_OTHER): Payer: Federal, State, Local not specified - PPO

## 2017-03-16 ENCOUNTER — Ambulatory Visit (INDEPENDENT_AMBULATORY_CARE_PROVIDER_SITE_OTHER): Payer: Federal, State, Local not specified - PPO | Admitting: Gynecology

## 2017-03-16 ENCOUNTER — Encounter: Payer: Self-pay | Admitting: Gynecology

## 2017-03-16 DIAGNOSIS — N83201 Unspecified ovarian cyst, right side: Secondary | ICD-10-CM

## 2017-03-16 NOTE — Progress Notes (Signed)
   Patient is a 21 year old who presented to the office for follow-up ultrasound as a result of a right ovarian cyst. Patient had a Nexplanon placed in December 2017 and has done well. Patient was asymptomatic today. Patient's ultrasound history as follows:  Ultrasound June 2017: The uterus measured 8.2 x 4.8 x 3.7 cm with endometrial stripe of 13.5 mm. Right ovary blood flow seen with thinwall cyst/solid mass avascular irregular border low level internal low level echoes measuring 4.5 x 3.4 x 4.9 cm average size 4.3 cm. There was some fluid in the cul-de-sac near the left adnexa measuring 19 x 36 x 17 mm. Left ovary was normal.  Ultrasound 06/30/2016:Uterus measured 5.5 x 5.1 x 3.6 cm endometrial stripe 5.3 mm she reports normal menstrual period September 5. Right ovary there was a thinwall diffusely homogeneous cyst measuring 2.4 x 1.9 x 2.4 cm negative color flow decreased in size from previous ultrasound. Left ovary was normal.   Patient received 2 injections of Depo-Provera 150 mg IM every 3 months   Ultrasound 09/24/2016:uterus measures 7.2 x 4.6 x 3.6 and meters with endometrial stripe of 3.6 mm. Left ovary was normal and no fluid in the cul-de-sac a thinwall cyst with diffuse homogeneous internal low level echoes measuring 2.8 x 1.5 x 2.2 cm average size 2.2 mm negative color flow Doppler.  Ultrasound today: Uterus measures 7.0 x 4.8 x 3.3 cm with endometrial stripe of 3.7 mm. Right ovary cyst diffuse internal low level echoes decreased in size from previous scan now measuring 14 x 10 x 11 mm negative color flow. Left ovary was normal. No fluid in the cul-de-sac.  Assessment/plan: 21 year old with complete resolution of right ovarian cyst now appears to be follicle present. Left ovary was normal. No other abnormality. Patient doing well with Nexplanon. Patient asymptomatic today. Patient scheduled to return in December for annual exam or when necessary.

## 2017-07-08 DIAGNOSIS — R062 Wheezing: Secondary | ICD-10-CM | POA: Diagnosis not present

## 2017-07-13 DIAGNOSIS — Z6841 Body Mass Index (BMI) 40.0 and over, adult: Secondary | ICD-10-CM | POA: Diagnosis not present

## 2017-07-13 DIAGNOSIS — J4520 Mild intermittent asthma, uncomplicated: Secondary | ICD-10-CM | POA: Diagnosis not present

## 2017-09-06 DIAGNOSIS — R5382 Chronic fatigue, unspecified: Secondary | ICD-10-CM | POA: Diagnosis not present

## 2017-09-06 DIAGNOSIS — Z136 Encounter for screening for cardiovascular disorders: Secondary | ICD-10-CM | POA: Diagnosis not present

## 2017-09-06 DIAGNOSIS — Z131 Encounter for screening for diabetes mellitus: Secondary | ICD-10-CM | POA: Diagnosis not present

## 2017-09-06 DIAGNOSIS — E282 Polycystic ovarian syndrome: Secondary | ICD-10-CM | POA: Diagnosis not present

## 2017-09-06 DIAGNOSIS — Z6841 Body Mass Index (BMI) 40.0 and over, adult: Secondary | ICD-10-CM | POA: Diagnosis not present

## 2017-09-06 DIAGNOSIS — E559 Vitamin D deficiency, unspecified: Secondary | ICD-10-CM | POA: Diagnosis not present

## 2017-09-06 DIAGNOSIS — Z13220 Encounter for screening for lipoid disorders: Secondary | ICD-10-CM | POA: Diagnosis not present

## 2017-09-13 DIAGNOSIS — F54 Psychological and behavioral factors associated with disorders or diseases classified elsewhere: Secondary | ICD-10-CM | POA: Diagnosis not present

## 2017-09-13 DIAGNOSIS — Z6841 Body Mass Index (BMI) 40.0 and over, adult: Secondary | ICD-10-CM | POA: Diagnosis not present

## 2017-09-13 DIAGNOSIS — Z7189 Other specified counseling: Secondary | ICD-10-CM | POA: Diagnosis not present

## 2017-10-05 DIAGNOSIS — Z713 Dietary counseling and surveillance: Secondary | ICD-10-CM | POA: Diagnosis not present

## 2017-10-21 DIAGNOSIS — J209 Acute bronchitis, unspecified: Secondary | ICD-10-CM | POA: Diagnosis not present

## 2017-10-28 DIAGNOSIS — E282 Polycystic ovarian syndrome: Secondary | ICD-10-CM | POA: Diagnosis not present

## 2017-10-28 DIAGNOSIS — Z6841 Body Mass Index (BMI) 40.0 and over, adult: Secondary | ICD-10-CM | POA: Diagnosis not present

## 2017-11-21 DIAGNOSIS — Z713 Dietary counseling and surveillance: Secondary | ICD-10-CM | POA: Diagnosis not present

## 2018-04-03 DIAGNOSIS — M9905 Segmental and somatic dysfunction of pelvic region: Secondary | ICD-10-CM | POA: Diagnosis not present

## 2018-04-03 DIAGNOSIS — M4057 Lordosis, unspecified, lumbosacral region: Secondary | ICD-10-CM | POA: Diagnosis not present

## 2018-04-03 DIAGNOSIS — M9903 Segmental and somatic dysfunction of lumbar region: Secondary | ICD-10-CM | POA: Diagnosis not present

## 2018-04-03 DIAGNOSIS — M9901 Segmental and somatic dysfunction of cervical region: Secondary | ICD-10-CM | POA: Diagnosis not present

## 2018-04-04 DIAGNOSIS — M9905 Segmental and somatic dysfunction of pelvic region: Secondary | ICD-10-CM | POA: Diagnosis not present

## 2018-04-04 DIAGNOSIS — M9903 Segmental and somatic dysfunction of lumbar region: Secondary | ICD-10-CM | POA: Diagnosis not present

## 2018-04-04 DIAGNOSIS — M4057 Lordosis, unspecified, lumbosacral region: Secondary | ICD-10-CM | POA: Diagnosis not present

## 2018-04-04 DIAGNOSIS — M9901 Segmental and somatic dysfunction of cervical region: Secondary | ICD-10-CM | POA: Diagnosis not present

## 2018-04-06 DIAGNOSIS — M9901 Segmental and somatic dysfunction of cervical region: Secondary | ICD-10-CM | POA: Diagnosis not present

## 2018-04-06 DIAGNOSIS — M4057 Lordosis, unspecified, lumbosacral region: Secondary | ICD-10-CM | POA: Diagnosis not present

## 2018-04-06 DIAGNOSIS — M9903 Segmental and somatic dysfunction of lumbar region: Secondary | ICD-10-CM | POA: Diagnosis not present

## 2018-04-06 DIAGNOSIS — M9905 Segmental and somatic dysfunction of pelvic region: Secondary | ICD-10-CM | POA: Diagnosis not present

## 2018-04-10 DIAGNOSIS — M9901 Segmental and somatic dysfunction of cervical region: Secondary | ICD-10-CM | POA: Diagnosis not present

## 2018-04-10 DIAGNOSIS — M9905 Segmental and somatic dysfunction of pelvic region: Secondary | ICD-10-CM | POA: Diagnosis not present

## 2018-04-10 DIAGNOSIS — M9903 Segmental and somatic dysfunction of lumbar region: Secondary | ICD-10-CM | POA: Diagnosis not present

## 2018-04-10 DIAGNOSIS — M4057 Lordosis, unspecified, lumbosacral region: Secondary | ICD-10-CM | POA: Diagnosis not present

## 2018-04-12 DIAGNOSIS — M9901 Segmental and somatic dysfunction of cervical region: Secondary | ICD-10-CM | POA: Diagnosis not present

## 2018-04-12 DIAGNOSIS — M9903 Segmental and somatic dysfunction of lumbar region: Secondary | ICD-10-CM | POA: Diagnosis not present

## 2018-04-12 DIAGNOSIS — M4057 Lordosis, unspecified, lumbosacral region: Secondary | ICD-10-CM | POA: Diagnosis not present

## 2018-04-12 DIAGNOSIS — M9905 Segmental and somatic dysfunction of pelvic region: Secondary | ICD-10-CM | POA: Diagnosis not present

## 2018-04-18 DIAGNOSIS — M9905 Segmental and somatic dysfunction of pelvic region: Secondary | ICD-10-CM | POA: Diagnosis not present

## 2018-04-18 DIAGNOSIS — M9903 Segmental and somatic dysfunction of lumbar region: Secondary | ICD-10-CM | POA: Diagnosis not present

## 2018-04-18 DIAGNOSIS — M4057 Lordosis, unspecified, lumbosacral region: Secondary | ICD-10-CM | POA: Diagnosis not present

## 2018-04-18 DIAGNOSIS — M9901 Segmental and somatic dysfunction of cervical region: Secondary | ICD-10-CM | POA: Diagnosis not present

## 2018-04-20 DIAGNOSIS — M9905 Segmental and somatic dysfunction of pelvic region: Secondary | ICD-10-CM | POA: Diagnosis not present

## 2018-04-20 DIAGNOSIS — M4057 Lordosis, unspecified, lumbosacral region: Secondary | ICD-10-CM | POA: Diagnosis not present

## 2018-04-20 DIAGNOSIS — M9901 Segmental and somatic dysfunction of cervical region: Secondary | ICD-10-CM | POA: Diagnosis not present

## 2018-04-20 DIAGNOSIS — M9903 Segmental and somatic dysfunction of lumbar region: Secondary | ICD-10-CM | POA: Diagnosis not present

## 2018-04-24 DIAGNOSIS — M4057 Lordosis, unspecified, lumbosacral region: Secondary | ICD-10-CM | POA: Diagnosis not present

## 2018-04-24 DIAGNOSIS — M9903 Segmental and somatic dysfunction of lumbar region: Secondary | ICD-10-CM | POA: Diagnosis not present

## 2018-04-24 DIAGNOSIS — M9901 Segmental and somatic dysfunction of cervical region: Secondary | ICD-10-CM | POA: Diagnosis not present

## 2018-04-24 DIAGNOSIS — M9905 Segmental and somatic dysfunction of pelvic region: Secondary | ICD-10-CM | POA: Diagnosis not present

## 2018-04-26 DIAGNOSIS — M9903 Segmental and somatic dysfunction of lumbar region: Secondary | ICD-10-CM | POA: Diagnosis not present

## 2018-04-26 DIAGNOSIS — M9905 Segmental and somatic dysfunction of pelvic region: Secondary | ICD-10-CM | POA: Diagnosis not present

## 2018-04-26 DIAGNOSIS — M9901 Segmental and somatic dysfunction of cervical region: Secondary | ICD-10-CM | POA: Diagnosis not present

## 2018-04-26 DIAGNOSIS — M4057 Lordosis, unspecified, lumbosacral region: Secondary | ICD-10-CM | POA: Diagnosis not present

## 2018-05-03 DIAGNOSIS — M9901 Segmental and somatic dysfunction of cervical region: Secondary | ICD-10-CM | POA: Diagnosis not present

## 2018-05-03 DIAGNOSIS — M9903 Segmental and somatic dysfunction of lumbar region: Secondary | ICD-10-CM | POA: Diagnosis not present

## 2018-05-03 DIAGNOSIS — M9905 Segmental and somatic dysfunction of pelvic region: Secondary | ICD-10-CM | POA: Diagnosis not present

## 2018-05-03 DIAGNOSIS — M4057 Lordosis, unspecified, lumbosacral region: Secondary | ICD-10-CM | POA: Diagnosis not present

## 2018-05-08 DIAGNOSIS — M9905 Segmental and somatic dysfunction of pelvic region: Secondary | ICD-10-CM | POA: Diagnosis not present

## 2018-05-08 DIAGNOSIS — M9901 Segmental and somatic dysfunction of cervical region: Secondary | ICD-10-CM | POA: Diagnosis not present

## 2018-05-08 DIAGNOSIS — M4057 Lordosis, unspecified, lumbosacral region: Secondary | ICD-10-CM | POA: Diagnosis not present

## 2018-05-08 DIAGNOSIS — M9903 Segmental and somatic dysfunction of lumbar region: Secondary | ICD-10-CM | POA: Diagnosis not present

## 2018-05-22 DIAGNOSIS — M4057 Lordosis, unspecified, lumbosacral region: Secondary | ICD-10-CM | POA: Diagnosis not present

## 2018-05-22 DIAGNOSIS — M9903 Segmental and somatic dysfunction of lumbar region: Secondary | ICD-10-CM | POA: Diagnosis not present

## 2018-05-22 DIAGNOSIS — M9905 Segmental and somatic dysfunction of pelvic region: Secondary | ICD-10-CM | POA: Diagnosis not present

## 2018-05-22 DIAGNOSIS — M9901 Segmental and somatic dysfunction of cervical region: Secondary | ICD-10-CM | POA: Diagnosis not present

## 2018-11-08 ENCOUNTER — Ambulatory Visit: Payer: Federal, State, Local not specified - PPO | Admitting: Allergy

## 2020-10-29 ENCOUNTER — Telehealth: Payer: Federal, State, Local not specified - PPO | Admitting: Family

## 2020-10-29 DIAGNOSIS — U071 COVID-19: Secondary | ICD-10-CM

## 2020-10-29 DIAGNOSIS — R0602 Shortness of breath: Secondary | ICD-10-CM

## 2020-10-29 NOTE — Progress Notes (Signed)
Based on what you have shared with me, you need to seek an "in-person" evaluation for respiratory symptoms related to Covid 19.   Please call for an appointment:   [facility] [address] [place], [place]  [phone]. Monday, Wednesday, Friday 5:30PM to 7:30PM  If you have severe symptoms of any kind, please seek medical care at an emergency room. Our Emergency Departments are best equipped to handle patients with severe symptoms.    . [facility] Emergency Department [address], [place], [place] [address] [phone]  . [facility] [place] Emergency Department [address], [place], [place] [address] [phone]  . [facility] Emergency Department [address], [place], [place] [address] [phone]  . [facility] Emergency Department [address], [place], [place] [address] [phone]  . [facility] Emergency Department [address], [place], [place] [address] [phone]   If you are having a true medical emergency please call 911.  NOTE: If you entered your credit card information for this eVisit, you will not be charged. You may see a "hold" on your card for the $35 but that hold will drop off and you will not have a charge processed.   Your e-visit answers were reviewed by a board certified advanced clinical practitioner to complete your personal care plan.  Thank you for using e-Visits  

## 2021-07-09 ENCOUNTER — Emergency Department (HOSPITAL_COMMUNITY)
Admission: EM | Admit: 2021-07-09 | Discharge: 2021-07-09 | Disposition: A | Discharge status: Home / Self Care | Payer: BC Managed Care – PPO | Attending: Emergency Medicine | Admitting: Emergency Medicine

## 2021-07-09 ENCOUNTER — Encounter (HOSPITAL_COMMUNITY): Payer: Self-pay

## 2021-07-09 ENCOUNTER — Emergency Department (HOSPITAL_COMMUNITY): Payer: BC Managed Care – PPO

## 2021-07-09 ENCOUNTER — Other Ambulatory Visit: Payer: Self-pay

## 2021-07-09 DIAGNOSIS — R1011 Right upper quadrant pain: Secondary | ICD-10-CM | POA: Diagnosis not present

## 2021-07-09 DIAGNOSIS — R109 Unspecified abdominal pain: Secondary | ICD-10-CM | POA: Diagnosis not present

## 2021-07-09 DIAGNOSIS — K760 Fatty (change of) liver, not elsewhere classified: Secondary | ICD-10-CM | POA: Diagnosis not present

## 2021-07-09 DIAGNOSIS — N9489 Other specified conditions associated with female genital organs and menstrual cycle: Secondary | ICD-10-CM | POA: Diagnosis not present

## 2021-07-09 DIAGNOSIS — N83202 Unspecified ovarian cyst, left side: Secondary | ICD-10-CM | POA: Diagnosis not present

## 2021-07-09 DIAGNOSIS — M549 Dorsalgia, unspecified: Secondary | ICD-10-CM | POA: Diagnosis not present

## 2021-07-09 HISTORY — DX: Endometriosis, unspecified: N80.9

## 2021-07-09 LAB — CBC
HCT: 41.4 % (ref 36.0–46.0)
Hemoglobin: 13.9 g/dL (ref 12.0–15.0)
MCH: 30.3 pg (ref 26.0–34.0)
MCHC: 33.6 g/dL (ref 30.0–36.0)
MCV: 90.2 fL (ref 80.0–100.0)
Platelets: 289 10*3/uL (ref 150–400)
RBC: 4.59 MIL/uL (ref 3.87–5.11)
RDW: 11.9 % (ref 11.5–15.5)
WBC: 12.5 10*3/uL — ABNORMAL HIGH (ref 4.0–10.5)
nRBC: 0 % (ref 0.0–0.2)

## 2021-07-09 LAB — LIPASE, BLOOD: Lipase: 48 U/L (ref 11–51)

## 2021-07-09 LAB — COMPREHENSIVE METABOLIC PANEL
ALT: 23 U/L (ref 0–44)
AST: 25 U/L (ref 15–41)
Albumin: 4.5 g/dL (ref 3.5–5.0)
Alkaline Phosphatase: 52 U/L (ref 38–126)
Anion gap: 9 (ref 5–15)
BUN: 18 mg/dL (ref 6–20)
CO2: 27 mmol/L (ref 22–32)
Calcium: 9.8 mg/dL (ref 8.9–10.3)
Chloride: 107 mmol/L (ref 98–111)
Creatinine, Ser: 0.85 mg/dL (ref 0.44–1.00)
GFR, Estimated: >60 mL/min (ref >60)
Glucose, Bld: 90 mg/dL (ref 70–99)
Potassium: 4.1 mmol/L (ref 3.5–5.1)
Sodium: 143 mmol/L (ref 135–145)
Total Bilirubin: 0.4 mg/dL (ref 0.3–1.2)
Total Protein: 8.1 g/dL (ref 6.5–8.1)

## 2021-07-09 LAB — URINALYSIS, ROUTINE W REFLEX MICROSCOPIC
Bilirubin Urine: NEGATIVE
Glucose, UA: NEGATIVE mg/dL
Hgb urine dipstick: NEGATIVE
Ketones, ur: NEGATIVE mg/dL
Nitrite: NEGATIVE
Protein, ur: NEGATIVE mg/dL
Specific Gravity, Urine: >1.046 — ABNORMAL HIGH (ref 1.005–1.030)
pH: 5 (ref 5.0–8.0)

## 2021-07-09 LAB — I-STAT BETA HCG BLOOD, ED (MC, WL, AP ONLY): I-stat hCG, quantitative: <5 m[IU]/mL (ref <5)

## 2021-07-09 MED ORDER — SODIUM CHLORIDE 0.9 % IV BOLUS
1000.0000 mL | Freq: Once | INTRAVENOUS | Status: AC
  Administered 2021-07-09: 1000 mL via INTRAVENOUS

## 2021-07-09 MED ORDER — IOHEXOL 350 MG/ML SOLN
100.0000 mL | Freq: Once | INTRAVENOUS | Status: AC | PRN
  Administered 2021-07-09: 100 mL via INTRAVENOUS

## 2021-07-09 MED ORDER — MORPHINE SULFATE (PF) 4 MG/ML IV SOLN
4.0000 mg | Freq: Once | INTRAVENOUS | Status: AC
Start: 2021-07-09 — End: 2021-07-09
  Administered 2021-07-09: 4 mg via INTRAVENOUS
  Filled 2021-07-09: qty 1

## 2021-07-09 MED ORDER — PREDNISONE 20 MG PO TABS: 40.0000 mg | ORAL_TABLET | Freq: Every day | ORAL | 0 refills | Status: AC

## 2021-07-09 MED ORDER — ONDANSETRON HCL 4 MG/2ML IJ SOLN
4.0000 mg | Freq: Once | INTRAMUSCULAR | Status: AC
  Administered 2021-07-09: 4 mg via INTRAVENOUS
  Filled 2021-07-09: qty 2

## 2021-07-09 MED ORDER — HYDROMORPHONE HCL 1 MG/ML IJ SOLN
1.0000 mg | Freq: Once | INTRAMUSCULAR | Status: AC
Start: 2021-07-09 — End: 2021-07-09
  Administered 2021-07-09: 1 mg via INTRAVENOUS
  Filled 2021-07-09: qty 1

## 2021-07-09 NOTE — Discharge Instructions (Addendum)
We discussed the results of your CT scans on today's visit.  Your laboratory results are within normal limits.  Urine did not show any signs of infection, however it was sent for culture.  We will place you on a short course of steroids to help with your symptoms.

## 2021-07-09 NOTE — ED Notes (Signed)
Pt given water 

## 2021-07-09 NOTE — ED Provider Notes (Signed)
[facility]-EMERGENCY DEPT Provider Note   CSN: [id number] Arrival date & time: 07/09/21  1633     History Chief Complaint  Patient presents with   Abdominal Pain   Back Pain    Ruth Evans is a 25 y.o. female.  25 y.o female with a past medical history of endometriosis presents to the ED with a chief complaint of sudden onset of right flank pain x1 week.  Patient describes this as an intermittent sensation sharp to the right flank with radiation to her right abdomen, this has been ongoing for the past week.  She does report a prior history of ovarian cyst, similar symptoms have been felt.  She is concerned for "kidney issues ", she has had some decrease in urination in the last couple days.  Reports in the last 24 hours she began to experience some nausea, diarrhea, although she does report most of her stool has always been liquid at baseline.  She does have a Nexplanon implant in place, reports she does not have menstrual cycles currently, she believes this has been in for "way too long ".  Has been taking Tylenol extra strength in the last couple of days without much improvement in her symptoms.  Denies any fever, chills, vaginal bleeding, vaginal discharge. No prior surgical abdominal intervention in the past.      The history is provided by the patient and medical records.  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   Ovarian cyst, right 12/19/2014   PTSD (post-traumatic stress disorder) 05/16/2014    Past  Surgical History:  Procedure Laterality Date   FOOT SURGERY Left 2011   bone shving   NASAL SEPTUM SURGERY     WISDOM TOOTH EXTRACTION       OB History     Gravida  0   Para      Term      Preterm      AB      Living         SAB      IAB      Ectopic      Multiple      Live Births              Family History  Problem Relation Age of Onset   Hypertension Mother    Macular degeneration Father    Cancer Maternal Grandfather        THYROID AND SKIN   Breast cancer Paternal Grandmother    Hypertension Paternal Grandfather    Heart disease Paternal Grandfather    Diabetes Maternal Aunt     Social History   Tobacco Use   Smoking status: Never   Smokeless tobacco: Never  Vaping Use   Vaping Use: Never used  Substance Use Topics   Alcohol use: No    Alcohol/week: 0.0 standard drinks   Drug use: Yes    Types: Marijuana    Home Medications Prior to Admission medications   Medication Sig Start  Date End Date Taking? Authorizing Provider  predniSONE (DELTASONE) 20 MG tablet Take 2 tablets (40 mg total) by mouth daily for 5 days. 07/09/21 07/14/21 Yes Keylah Darwish, PA-C  albuterol (PROVENTIL HFA;VENTOLIN HFA) 108 (90 BASE) MCG/ACT inhaler Inhale 2 puffs into the lungs every 6 (six) hours as needed for wheezing or shortness of breath.    [provider]  Biotin 1 MG CAPS Take 1 capsule by mouth daily.    [provider]  cetirizine (ZYRTEC) 10 MG tablet Take 10 mg by mouth daily.    [provider]  fluticasone (FLONASE) 50 MCG/ACT nasal spray Place into both nostrils daily.    [provider]  metroNIDAZOLE (FLAGYL) 500 MG tablet Take 1 tablet by mouth twice a day for 2 weeks Patient not taking: Reported on 09/24/2016 03/05/16   Ok Edwards, MD    Allergies    Codeine and Nsaids  Review of Systems   Review of Systems  Constitutional:  Positive for chills. Negative for fever.  HENT:  Negative for sore throat.    Respiratory:  Negative for shortness of breath.   Cardiovascular:  Negative for chest pain.  Gastrointestinal:  Positive for abdominal pain, diarrhea and nausea. Negative for vomiting.  Genitourinary:  Positive for difficulty urinating and flank pain.  Musculoskeletal:  Positive for back pain.  Skin:  Negative for wound.  Neurological:  Negative for light-headedness and headaches.  All other systems reviewed and are negative.  Physical Exam Updated Vital Signs BP 126/71   Pulse 63   Temp 98.2 F (36.8 C) (Oral)   Resp 16   Ht 5\' 5"  (1.651 m)   Wt (!) 145.2 kg   SpO2 97%   BMI 53.25 kg/m   Physical Exam Vitals and nursing note reviewed.  Constitutional:      Appearance: She is well-developed. She is obese. She is ill-appearing and diaphoretic.  HENT:     Head: Normocephalic and atraumatic.  Cardiovascular:     Rate and Rhythm: Normal rate.  Pulmonary:     Effort: Pulmonary effort is normal.     Breath sounds: No wheezing.  Abdominal:     Tenderness: There is abdominal tenderness in the right upper quadrant and right lower quadrant. There is right CVA tenderness.  Skin:    General: Skin is warm.  Neurological:     Mental Status: She is alert.    ED Results / Procedures / Treatments   Labs (all labs ordered are listed, but only abnormal results are displayed) Labs Reviewed  CBC - Abnormal; Notable for the following components:      Result Value   WBC 12.5 (*)    All other components within normal limits  URINALYSIS, ROUTINE W REFLEX MICROSCOPIC - Abnormal; Notable for the following components:   Specific Gravity, Urine >1.046 (*)    Leukocytes,Ua TRACE (*)    Bacteria, UA RARE (*)    All other components within normal limits  LIPASE, BLOOD  COMPREHENSIVE METABOLIC PANEL  I-STAT BETA HCG BLOOD, ED ([facility], [facility], [facility] ONLY)    EKG None  Radiology CT Renal Stone Study  Result Date: 07/09/2021 CLINICAL DATA:  Right flank pain for 1 week. EXAM: CT ABDOMEN AND PELVIS  WITHOUT CONTRAST TECHNIQUE: Multidetector CT imaging of the abdomen and pelvis was performed following the standard protocol without IV contrast. COMPARISON:  03/04/2016 FINDINGS: Lower chest: The lung bases are clear of acute process. No pleural effusion or pulmonary lesions. The heart is normal in  size. No pericardial effusion. The distal esophagus and aorta are unremarkable. Hepatobiliary: No focal hepatic lesions or intrahepatic biliary dilatation. The gallbladder is normal. No common bile duct dilatation. Pancreas: No mass, inflammation or ductal dilatation. Spleen: Normal size.  No focal lesions. Adrenals/Urinary Tract: Adrenal glands are normal. No renal, ureteral or bladder calculi or mass. Stomach/Bowel: The stomach, duodenum, small bowel and colon are grossly normal without oral contrast. No inflammatory changes, mass lesions or obstructive findings. The terminal ileum is normal. The appendix is normal. Vascular/Lymphatic: The aorta is normal in caliber. No atheroscerlotic calcifications. No mesenteric of retroperitoneal mass or adenopathy. Small scattered lymph nodes are noted. Reproductive: The uterus and ovaries unremarkable. Other: No pelvic mass or adenopathy. No free pelvic fluid collections. No inguinal mass or adenopathy. No abdominal wall hernia or subcutaneous lesions. Musculoskeletal: No significant bony findings. IMPRESSION: 1. No acute abdominal/pelvic findings, mass lesions or adenopathy. 2. No renal, ureteral or bladder calculi or mass. Electronically Signed   By: Rudie Meyer M.D.   On: 07/09/2021 18:30   CT Angio Abd/Pel W and/or Wo Contrast  Result Date: 07/09/2021 CLINICAL DATA:  Evaluate for renal artery dissection EXAM: CTA ABDOMEN AND PELVIS WITHOUT AND WITH CONTRAST TECHNIQUE: Multidetector CT imaging of the abdomen and pelvis was performed using the standard protocol during bolus administration of intravenous contrast. Multiplanar reconstructed images and MIPs were obtained and  reviewed to evaluate the vascular anatomy. CONTRAST:  OMNIPAQUE IOHEXOL 350 MG/ML SOLN COMPARISON:  None. FINDINGS: VASCULAR Aorta: Normal caliber aorta without aneurysm, dissection, vasculitis or significant stenosis. Celiac: Patent without evidence of aneurysm, dissection, vasculitis or significant stenosis. SMA: Patent without evidence of aneurysm, dissection, vasculitis or significant stenosis. Renals: Normal appearance of the left renal artery. Short segment of distal right renal artery just before the renal hilum where there is mild wall thickening and haziness with narrowing of the luminal diameter suggestive of vasculitis. No signs of dissection. IMA: Patent without evidence of aneurysm, dissection, vasculitis or significant stenosis. Inflow: Patent without evidence of aneurysm, dissection, vasculitis or significant stenosis. Proximal Outflow: Bilateral common femoral and visualized portions of the superficial and profunda femoral arteries are patent without evidence of aneurysm, dissection, vasculitis or significant stenosis. Veins: No obvious venous abnormality within the limitations of this arterial phase study. Review of the MIP images confirms the above findings. NON-VASCULAR Lower chest: No acute abnormality. Hepatobiliary: No focal liver abnormality is seen. No gallstones, gallbladder wall thickening, or biliary dilatation. Pancreas: Unremarkable. No pancreatic ductal dilatation or surrounding inflammatory changes. Spleen: Normal in size without focal abnormality. Adrenals/Urinary Tract: Adrenal glands are unremarkable. Kidneys are normal, without renal calculi, focal lesion, or hydronephrosis. Bladder is unremarkable. Stomach/Bowel: Stomach is within normal limits. Appendix appears normal. No evidence of bowel wall thickening, distention, or inflammatory changes. Lymphatic: No significant vascular findings are present. No enlarged abdominal or pelvic lymph nodes. Reproductive: Cyst within the  left ovary measures 2.8 cm. Uterus is unremarkable. Other: No free fluid or fluid collections. Musculoskeletal: No acute or significant osseous findings. IMPRESSION: 1. No evidence for renal artery dissection. 2. Short segment of distal right renal artery just before the renal hilum where there is mild wall thickening and haziness with narrowing of the luminal diameter suggestive of vasculitis. 3. Both kidneys appear normal without signs of infarct. Electronically Signed   By: Signa Kell M.D.   On: 07/09/2021 20:52   US Abdomen Limited RUQ (LIVER/GB)  Result Date: 07/09/2021 CLINICAL DATA:  Right upper quadrant pain. EXAM: ULTRASOUND ABDOMEN  LIMITED RIGHT UPPER QUADRANT COMPARISON:  None. FINDINGS: Gallbladder: No gallstones or wall thickening visualized (2.0 mm). No sonographic Murphy sign noted by sonographer. Common bile duct: Diameter: 3.0 mm Liver: No focal lesion identified. Diffusely increased echogenicity of the liver parenchyma is noted. Portal vein is patent on color Doppler imaging with normal direction of blood flow towards the liver. Other: The study is limited secondary to the patient's large body habitus and overlying bowel gas, as per the ultrasound technologist. IMPRESSION: Hepatic steatosis. Electronically Signed   By: Aram Candela M.D.   On: 07/09/2021 20:50    Procedures Procedures   Medications Ordered in ED Medications  morphine 4 MG/ML injection 4 mg (4 mg Intravenous Given 07/09/21 1717)  ondansetron (ZOFRAN) injection 4 mg (4 mg Intravenous Given 07/09/21 1717)  sodium chloride 0.9 % bolus 1,000 mL (0 mLs Intravenous Stopped 07/09/21 1816)  sodium chloride 0.9 % bolus 1,000 mL (0 mLs Intravenous Stopped 07/09/21 2207)  HYDROmorphone (DILAUDID) injection 1 mg (1 mg Intravenous Given 07/09/21 1953)  iohexol (OMNIPAQUE) 350 MG/ML injection 100 mL (100 mLs Intravenous Contrast Given 07/09/21 2002)    ED Course  I have reviewed the triage vital signs and the nursing  notes.  Pertinent labs & imaging results that were available during my care of the patient were reviewed by me and considered in my medical decision making (see chart for details).  Clinical Course as of 07/09/21 2320  Thu Jul 09, 2021  2244 Leukocytes,Ua(!): TRACE [JS]  2244 Bacteria, UA(!): RARE [JS]    Clinical Course User Index [JS] Claude Manges, PA-C   MDM Rules/Calculators/A&P   Patient presents to the ED with a chief complaint of right upper quadrant pain, right flank pain that began about a week ago, not alleviated with any over-the-counter medication.  Does not have a prior history of kidney stones, feels that this is something similar.  My evaluation patient is diaphoretic, vitals are remarkable for slight increase in her heart rate at 96, no hypoxia.  Blood pressure is within normal limits.  Does appear very uncomfortable and is pacing the room, pain is worse with some right CVA tenderness on my exam.  There is right upper quadrant pain palpable reproducible.  Bowel sounds are normal.  She does report diarrhea at baseline without any significant changes to her stool.  No rectal bleeding noted.  Lungs are clear to auscultation, the rest of her exam is benign.  Some suspicion for renal versus biliary colic, ultrasound obtain which showed no acute findings.  Interpretation of her labs with a CBC with a slight leukocytosis of 12.5, hemoglobin is within normal limits.  CMP without any electrolyte derangement, current levels within normal limits.  LFTs unremarkable.  UA with trace of leukocytes, rare bacteria, she does report some decrease in urination but no dysuria or visible hematuria.  Lipase level is normal.  Is any vaginal bleeding, last menstrual period is unknown if she currently has a Nexplanon and needs to be removed in February 2022.  She did receive morphine to help with pain, bolus, Zofran to help with nausea.   We did obtain a CT renal stone study along with a CT angio  abdomen and pelvis which showed: 1. No evidence for renal artery dissection.  2. Short segment of distal right renal artery just before the renal  hilum where there is mild wall thickening and haziness with  narrowing of the luminal diameter suggestive of vasculitis.  3. Both kidneys appear normal without signs  of infarct.         She was also given additional medication to help with pain.  Consistent with Dilaudid.  There is some questionable finding for vasculitis on her CT, this was explained to patient.  Pain has resolved.  Sure whether this is a theology from her pain.  Although vitals have remained stable and she does have a good creatinine function.  Did discuss appropriate follow-up with nephrology, she also need to follow-up with PCP.  Patient understands and agrees with management, return precautions discussed at length.  Patient stable for discharge   Portions of this note were generated with Dragon dictation software. Dictation errors may occur despite best attempts at proofreading.  Final Clinical Impression(s) / ED Diagnoses Final diagnoses:  RUQ pain  Right upper quadrant abdominal pain    Rx / DC Orders ED Discharge Orders          Ordered    predniSONE (DELTASONE) 20 MG tablet  Daily        07/09/21 2315             Claude Manges, PA-C 07/09/21 2322    Charlynne Pander, MD 07/10/21 [phone]

## 2021-07-09 NOTE — ED Triage Notes (Signed)
Patient reports back pain x 1 week. Patient states the pain comes around to the right upper abdomen now and pain has worsened. Patient reports diarrhea and nausea today.

## 2021-11-03 IMAGING — CT CT RENAL STONE PROTOCOL
2 of 4 series · 16 of 46 positions shown, 18 images · non-contrast
Comparison: 03/04/2016

CLINICAL DATA: Right flank pain for 1 week.

EXAM:
CT ABDOMEN AND PELVIS WITHOUT CONTRAST
TECHNIQUE: Multidetector CT imaging of the abdomen and pelvis was performed
following the standard protocol without IV contrast.

[Series 2: axial st · axial · 0.89mm/px · z∈[-514,-84]mm · 13 of 100 slices shown, 15 images]
[im 7/100  soft-tissue]
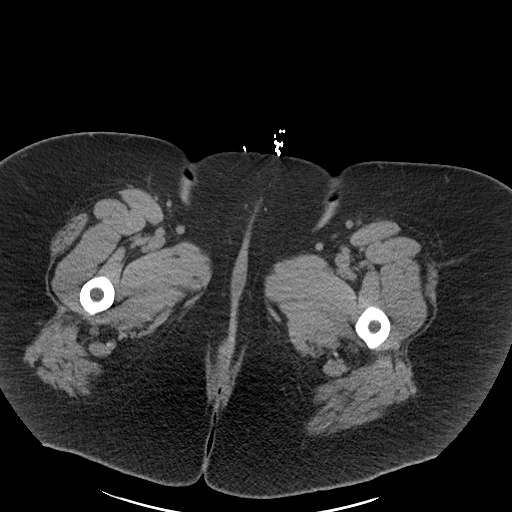
[im 7/100  bone]
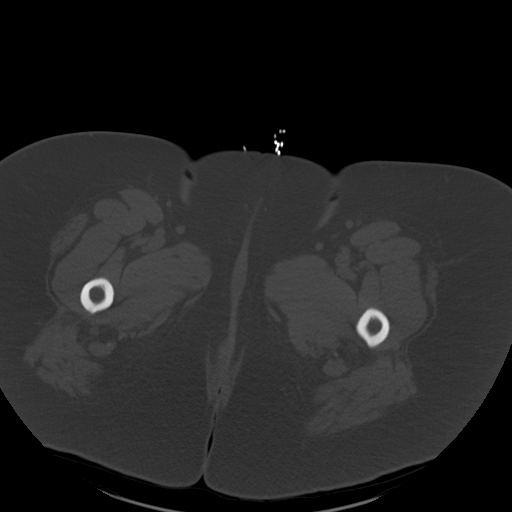
[im 13/100  soft-tissue]
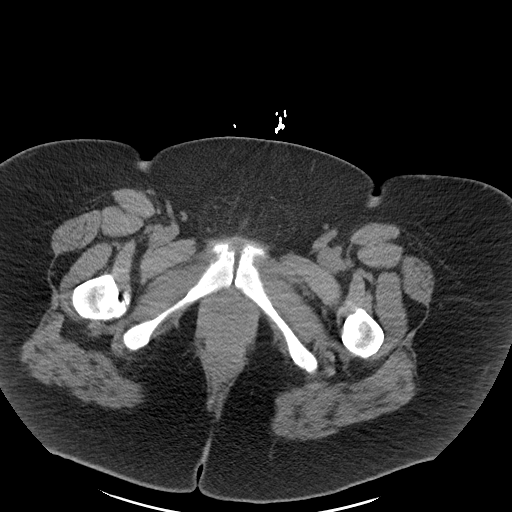
[im 19/100  soft-tissue]
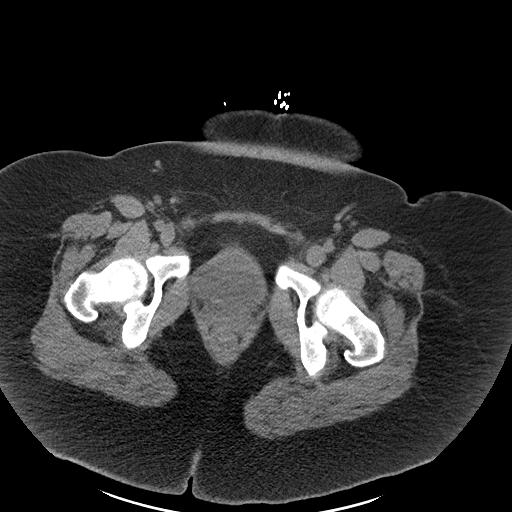
[im 31/100  soft-tissue]
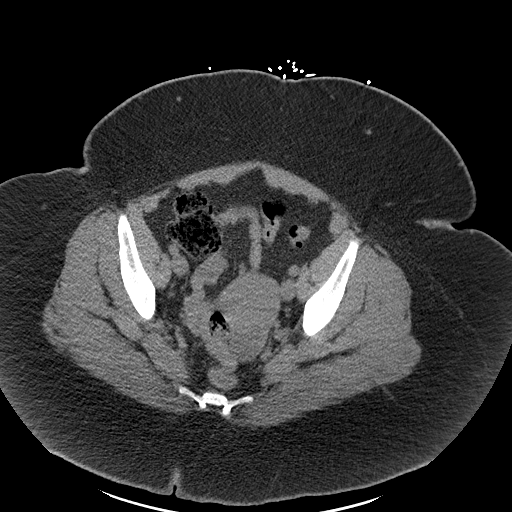
[im 38/100  soft-tissue]
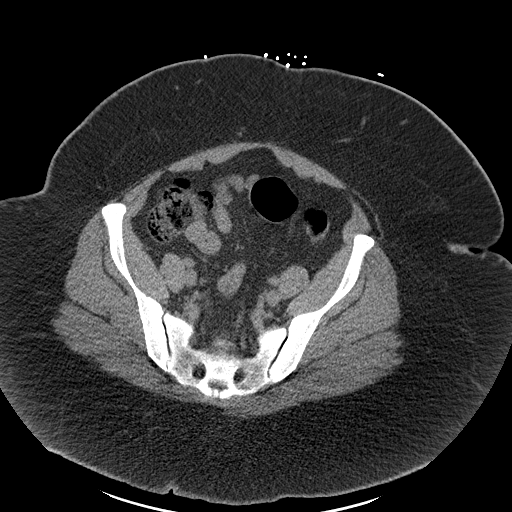
[im 44/100  soft-tissue]
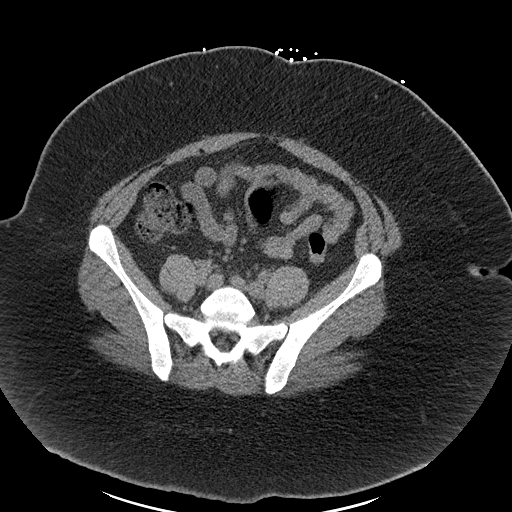
[im 50/100  soft-tissue]
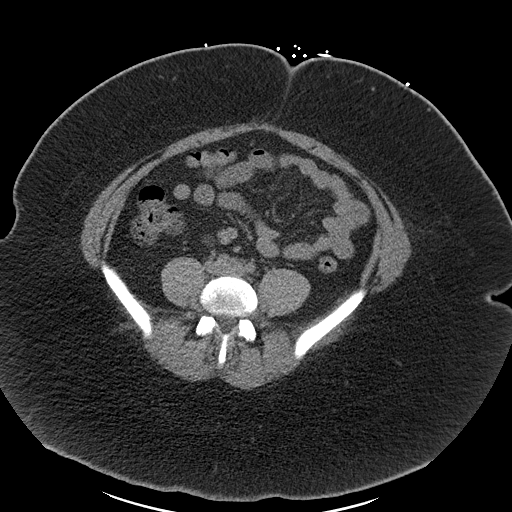
[im 56/100  soft-tissue]
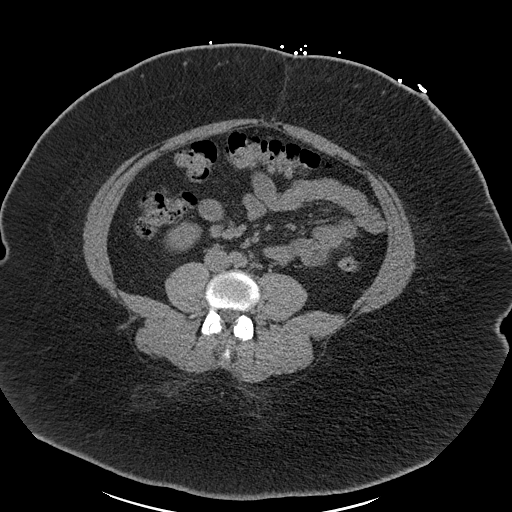
[im 62/100  soft-tissue]
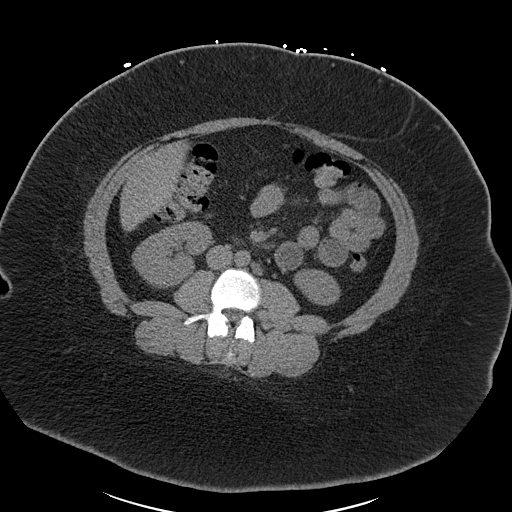
[im 62/100  bone]
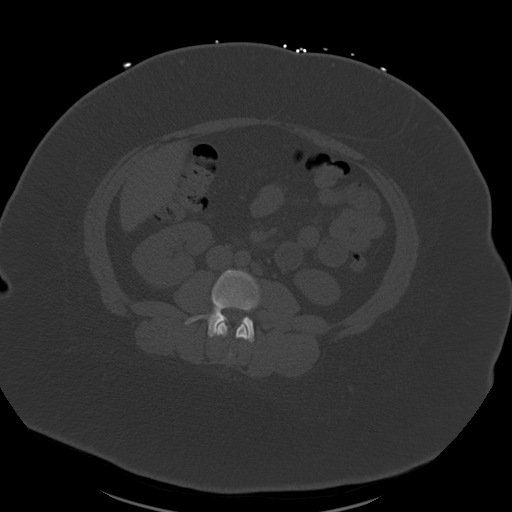
[im 69/100  soft-tissue]
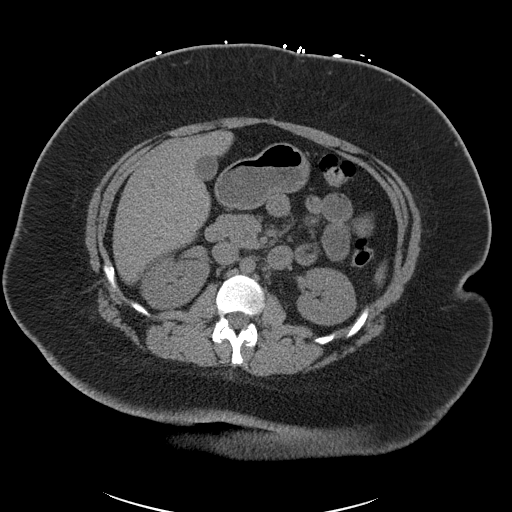
[im 81/100  soft-tissue]
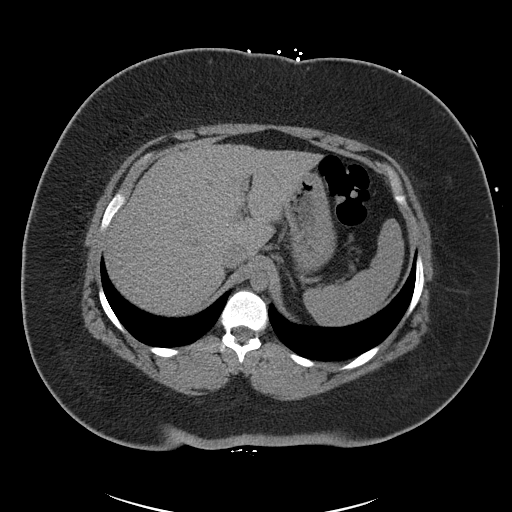
[im 87/100  soft-tissue]
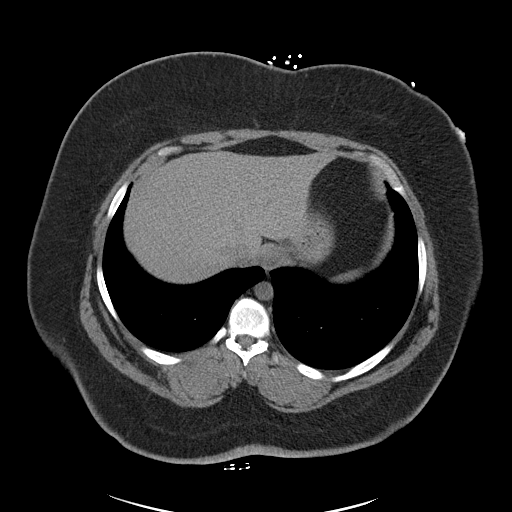
[im 93/100  soft-tissue]
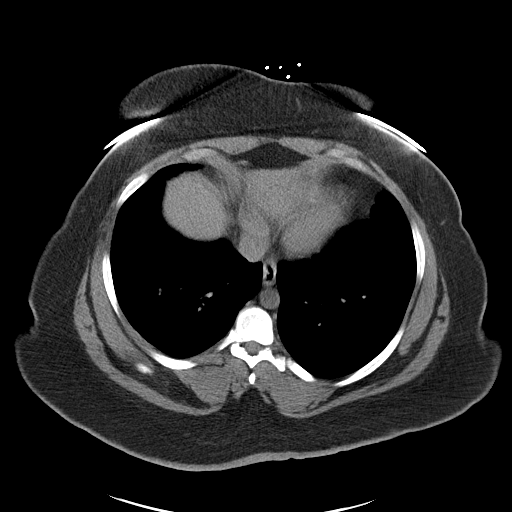

[Series 4: coronal · coronal · 0.83mm/px · 3 of 185 slices shown]
[im 62/185  soft-tissue]
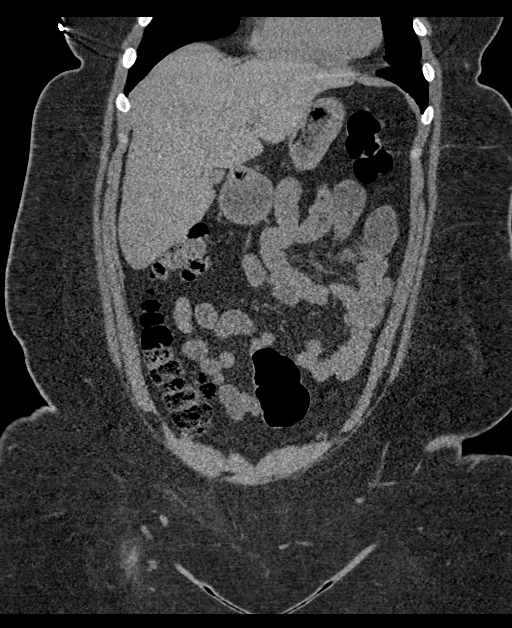
[im 82/185  soft-tissue]
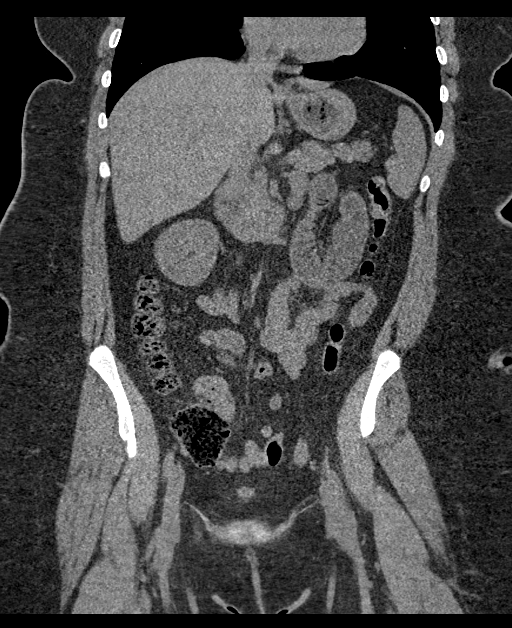
[im 103/185  soft-tissue]
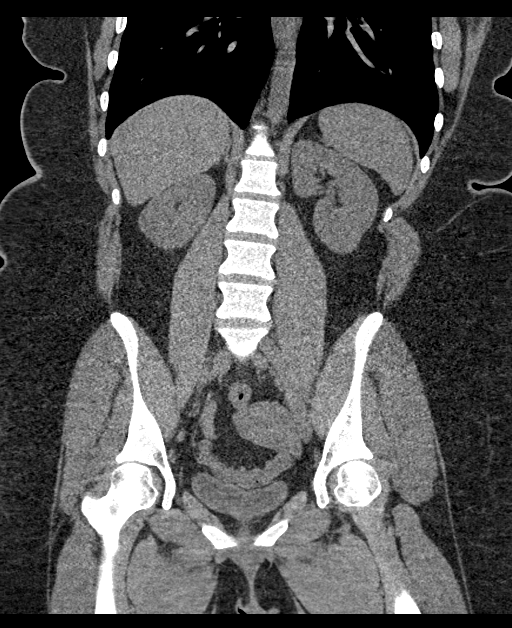

[16 of 46 positions shown; findings below may reference images not displayed]

FINDINGS: Lower chest: The lung bases are clear of acute process. No pleural
effusion or pulmonary lesions. The heart is normal in size. No
pericardial effusion. The distal esophagus and aorta are
unremarkable.

Hepatobiliary: No focal hepatic lesions or intrahepatic biliary
dilatation. The gallbladder is normal. No common bile duct
dilatation.

Pancreas: No mass, inflammation or ductal dilatation.

Spleen: Normal size.  No focal lesions.

Adrenals/Urinary Tract: Adrenal glands are normal.

No renal, ureteral or bladder calculi or mass.

Stomach/Bowel: The stomach, duodenum, small bowel and colon are
grossly normal without oral contrast. No inflammatory changes, mass
lesions or obstructive findings. The terminal ileum is normal. The
appendix is normal.

Vascular/Lymphatic: The aorta is normal in caliber. No
atheroscerlotic calcifications. No mesenteric of retroperitoneal
mass or adenopathy. Small scattered lymph nodes are noted.

Reproductive: The uterus and ovaries unremarkable.

Other: No pelvic mass or adenopathy. No free pelvic fluid
collections. No inguinal mass or adenopathy. No abdominal wall
hernia or subcutaneous lesions.

Musculoskeletal: No significant bony findings.
IMPRESSION: 1. No acute abdominal/pelvic findings, mass lesions or adenopathy.
2. No renal, ureteral or bladder calculi or mass.

## 2021-11-03 IMAGING — US US ABDOMEN LIMITED
1 series · 15 of 25 positions shown · non-contrast
Comparison: None.

CLINICAL DATA: Right upper quadrant pain.

EXAM:
ULTRASOUND ABDOMEN LIMITED RIGHT UPPER QUADRANT

[Series 1: us abdomen limited ruq [facility] & [facility] · 15 of 39 slices shown]
[im 1/39]
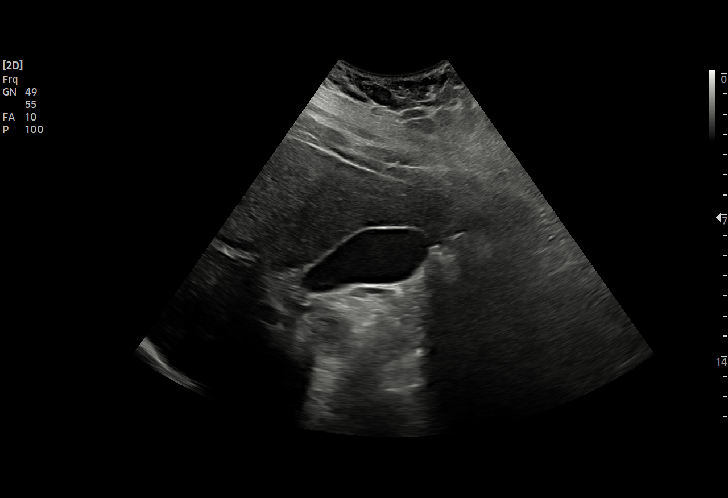
[im 4/39]
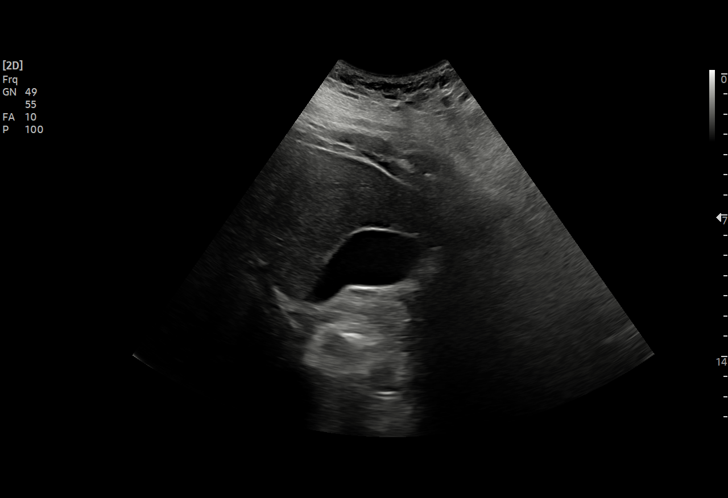
[im 7/39]
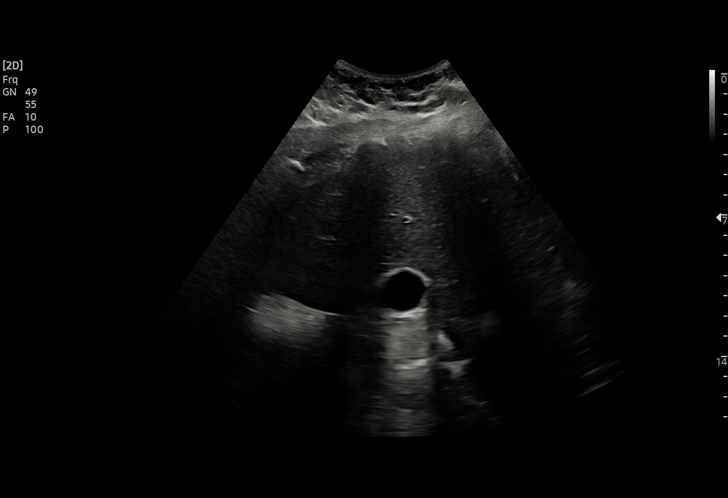
[im 8/39]
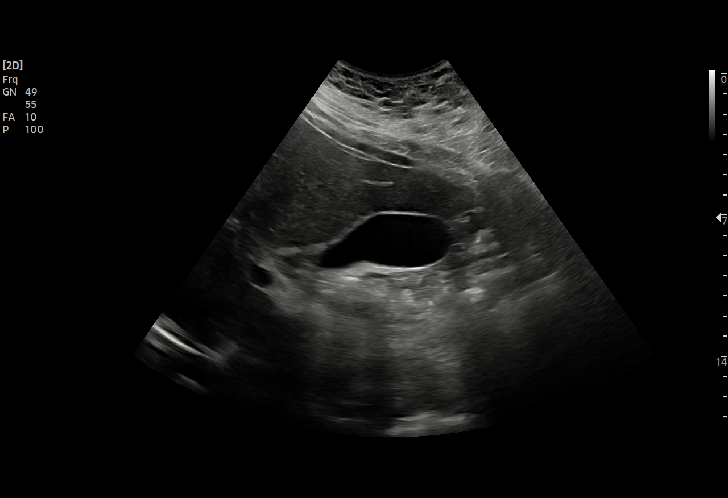
[im 12/39]
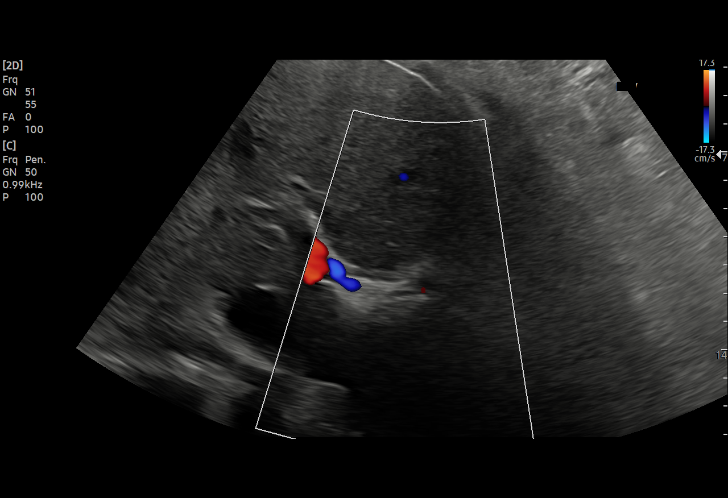
[im 15/39]
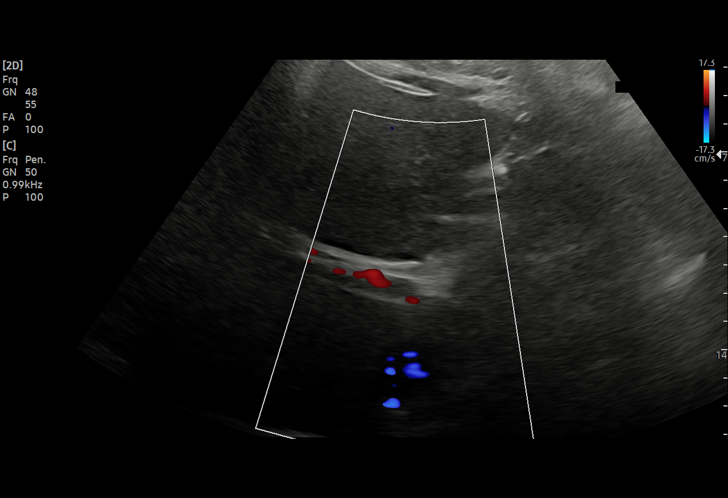
[im 16/39]
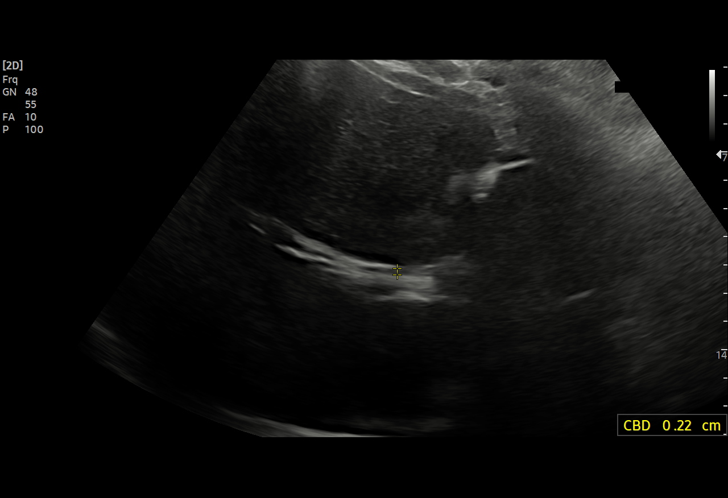
[im 20/39]
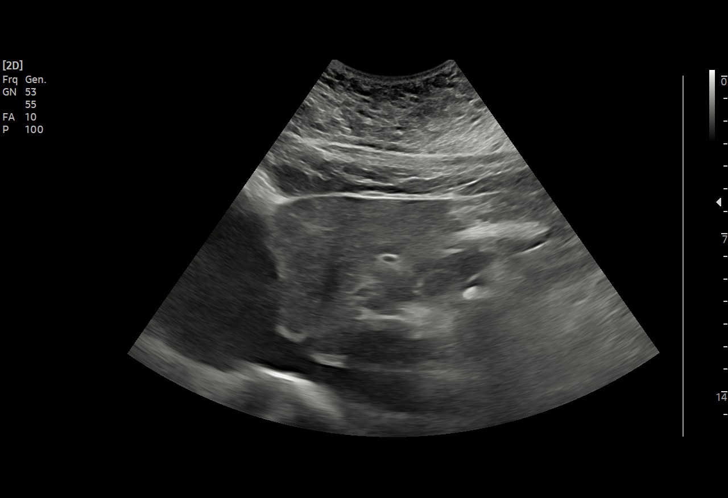
[im 23/39]
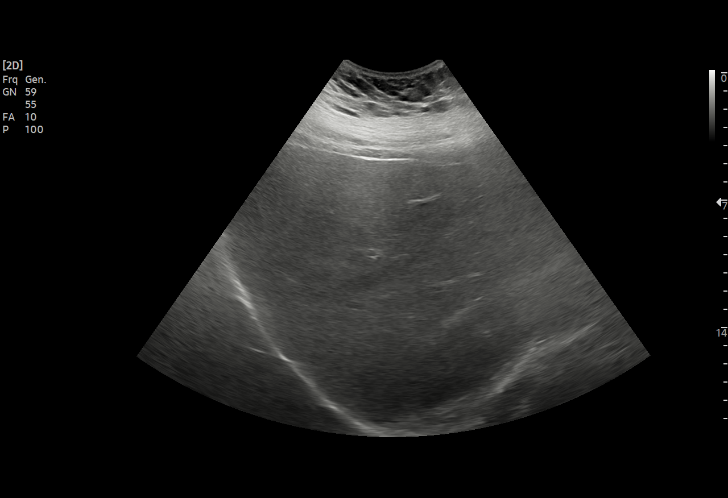
[im 24/39]
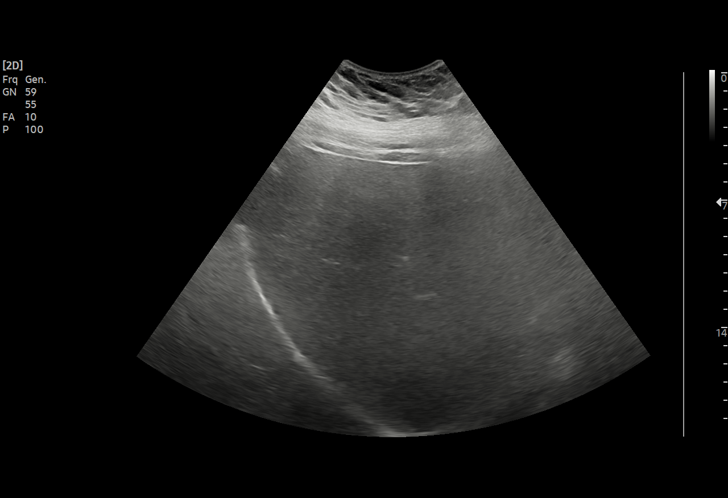
[im 27/39]
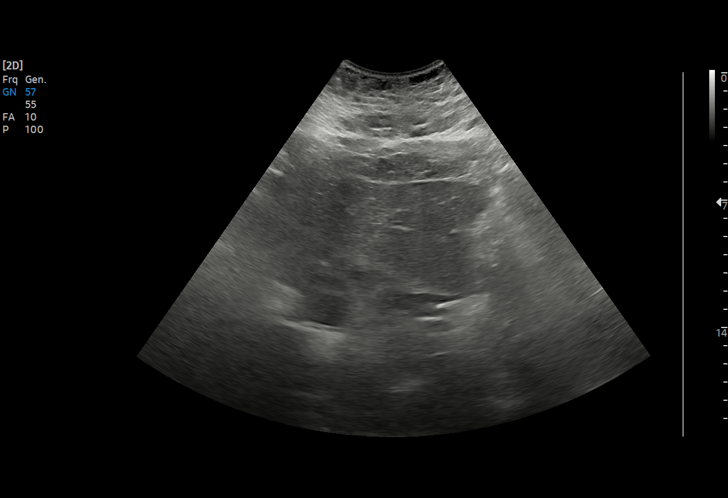
[im 31/39]
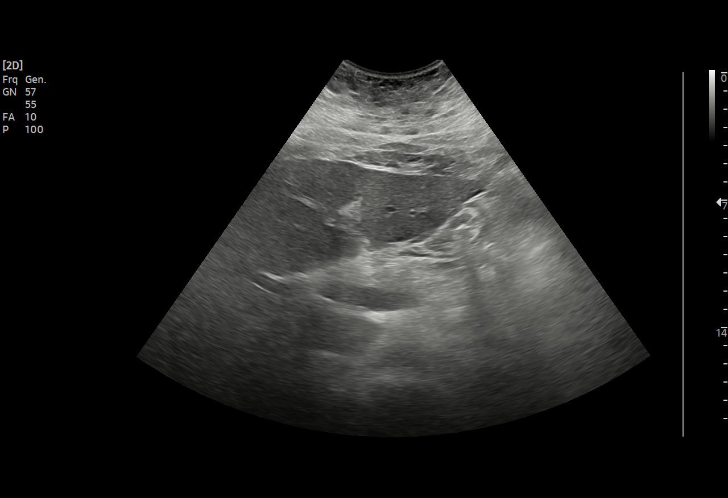
[im 32/39]
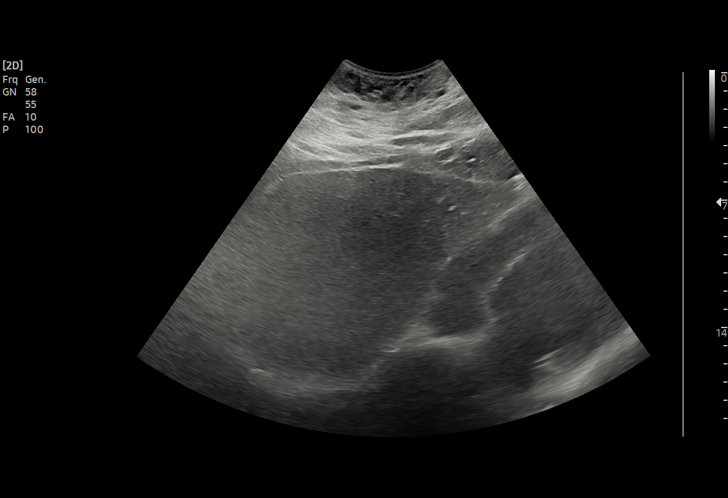
[im 35/39]
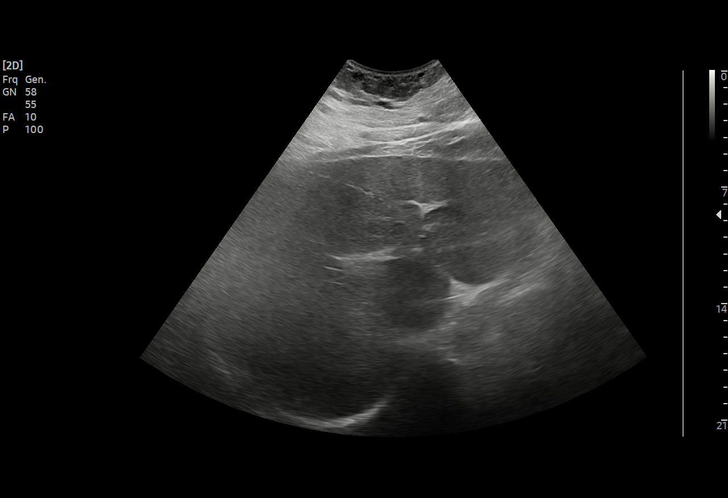
[im 39/39]
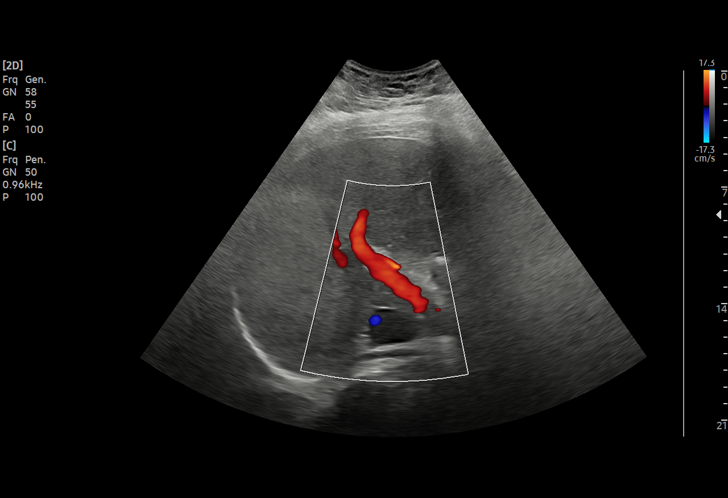

[15 of 25 positions shown; findings below may reference images not displayed]

FINDINGS: Gallbladder:

No gallstones or wall thickening visualized (2.0 mm). No sonographic
Murphy sign noted by sonographer.

Common bile duct:

Diameter: 3.0 mm

Liver:

No focal lesion identified. Diffusely increased echogenicity of the
liver parenchyma is noted. Portal vein is patent on color Doppler
imaging with normal direction of blood flow towards the liver.

Other: The study is limited secondary to the patient's large body
habitus and overlying bowel gas, as per the ultrasound technologist.
IMPRESSION: Hepatic steatosis.

## 2021-11-03 IMAGING — CT CT CTA ABD/PEL W/CM AND/OR W/O CM
2 of 6 series · 14 of 46 positions shown, 18 images · IV contrast (omnipaque)
Comparison: None.

CLINICAL DATA: Evaluate for renal artery dissection

EXAM:
CTA ABDOMEN AND PELVIS WITHOUT AND WITH CONTRAST
TECHNIQUE: Multidetector CT imaging of the abdomen and pelvis was performed
using the standard protocol during bolus administration of
intravenous contrast. Multiplanar reconstructed images and MIPs were
obtained and reviewed to evaluate the vascular anatomy.
CONTRAST:  100mL OMNIPAQUE IOHEXOL 350 MG/ML SOLN

[Series 4: axial arterial · axial · arterial · 0.88mm/px · z∈[+943,+1375]mm · 11 of 170 slices shown, 15 images]
[im 17/170  soft-tissue]
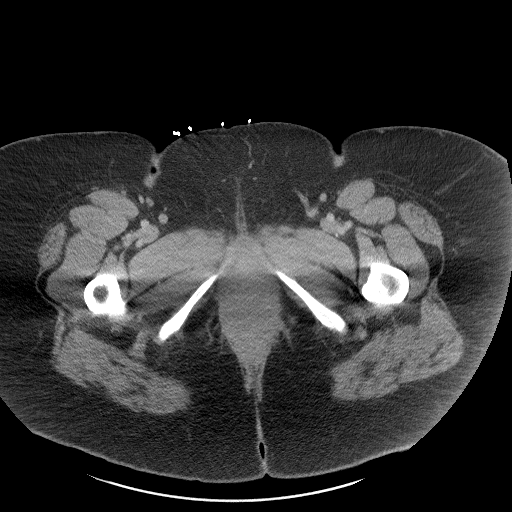
[im 17/170  bone]
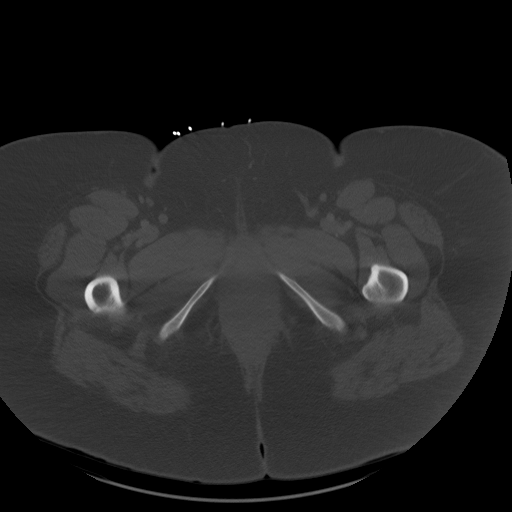
[im 34/170  soft-tissue]
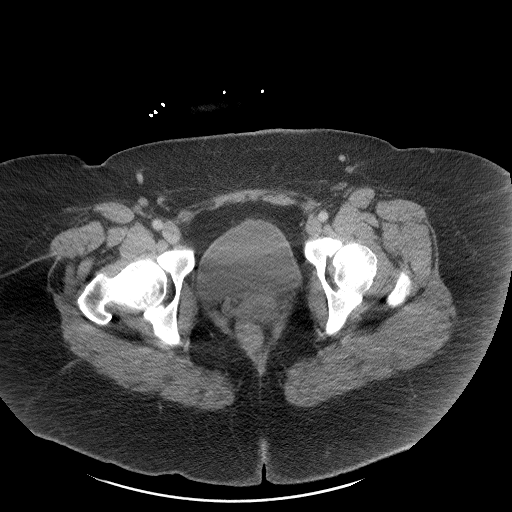
[im 51/170  soft-tissue]
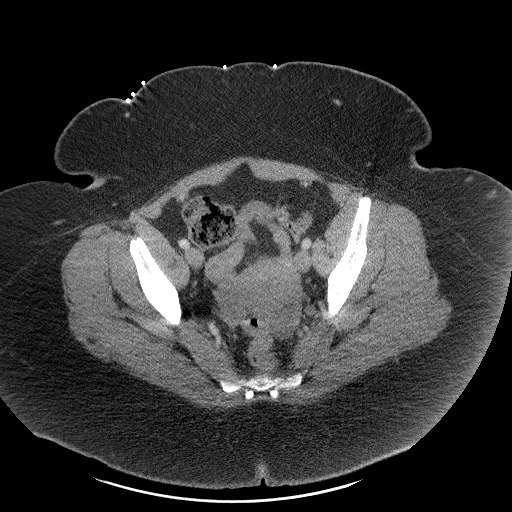
[im 68/170  soft-tissue]
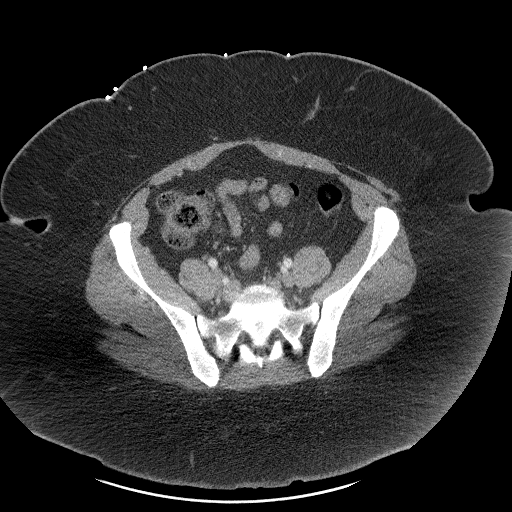
[im 85/170  soft-tissue]
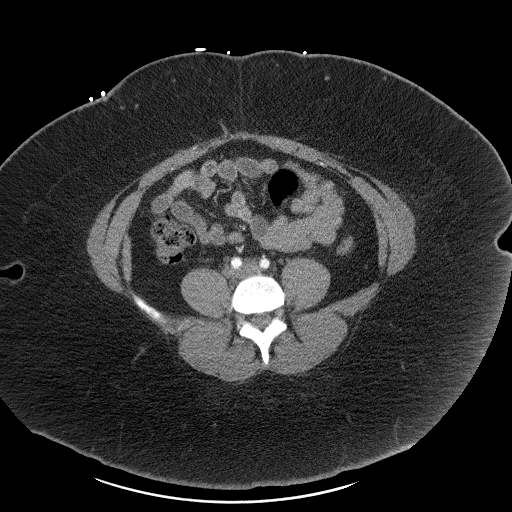
[im 102/170  soft-tissue]
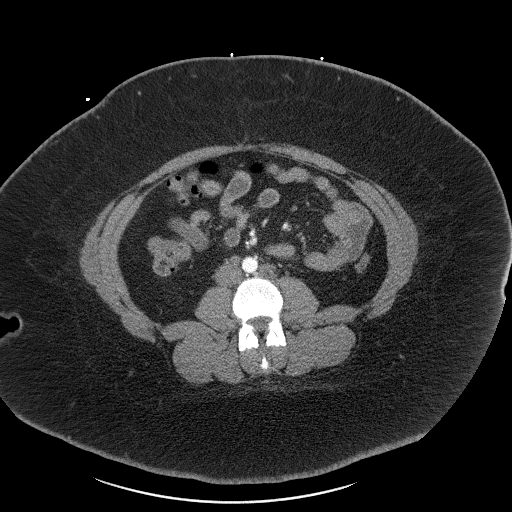
[im 119/170  soft-tissue]
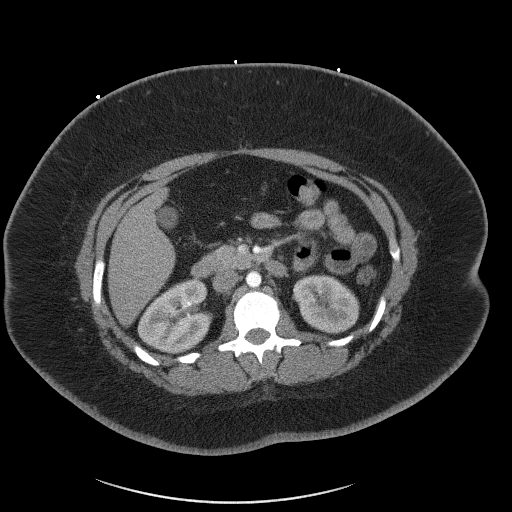
[im 136/170  soft-tissue]
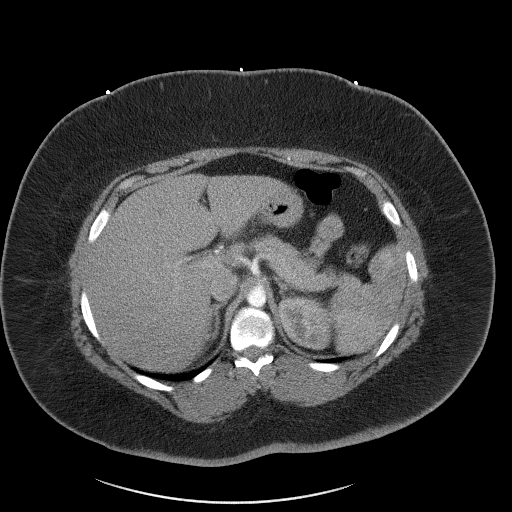
[im 136/170  lung]
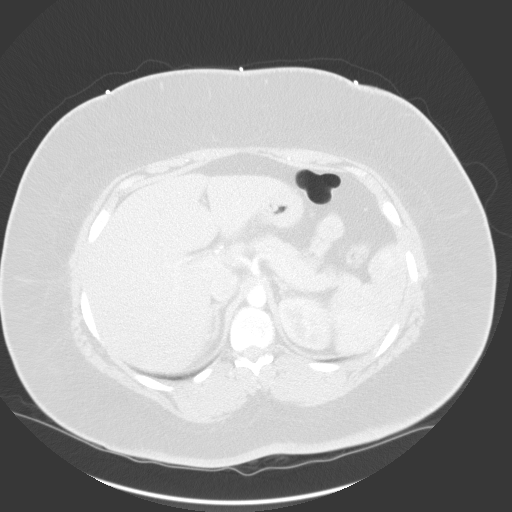
[im 144/170  lung]
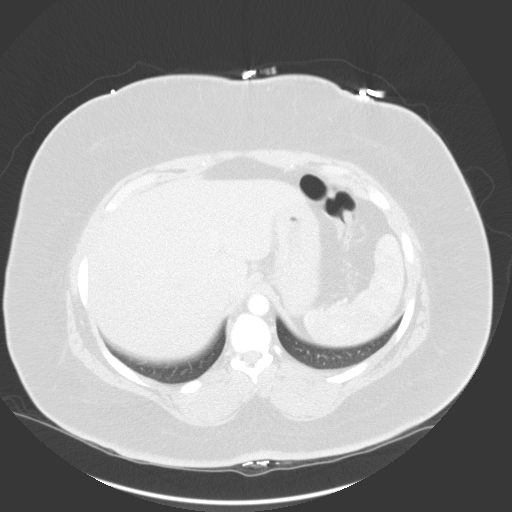
[im 153/170  soft-tissue]
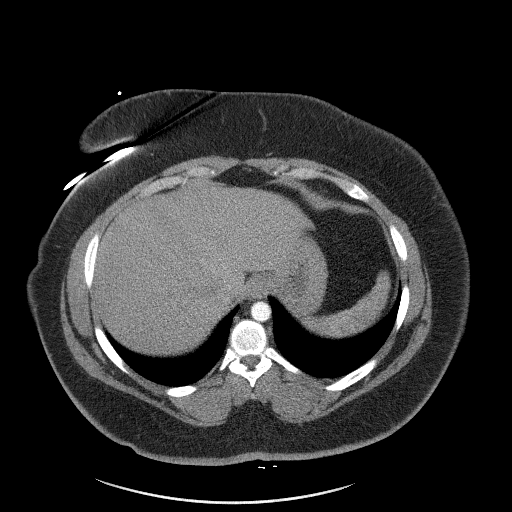
[im 153/170  lung]
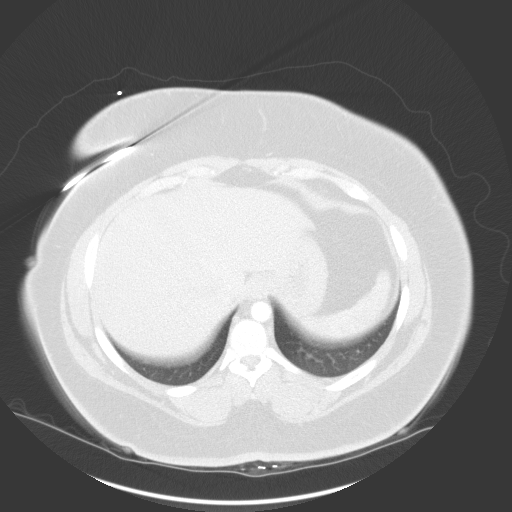
[im 153/170  bone]
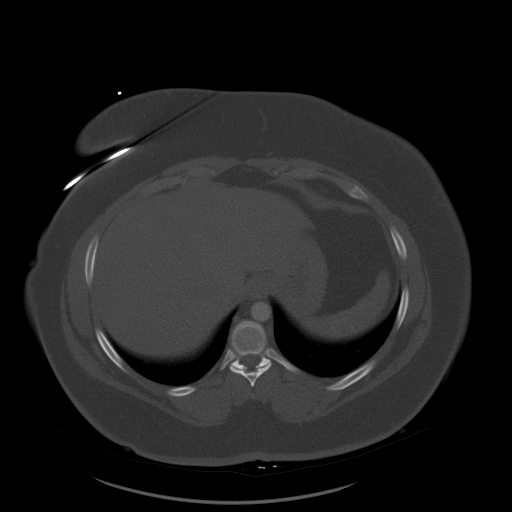
[im 161/170  lung]
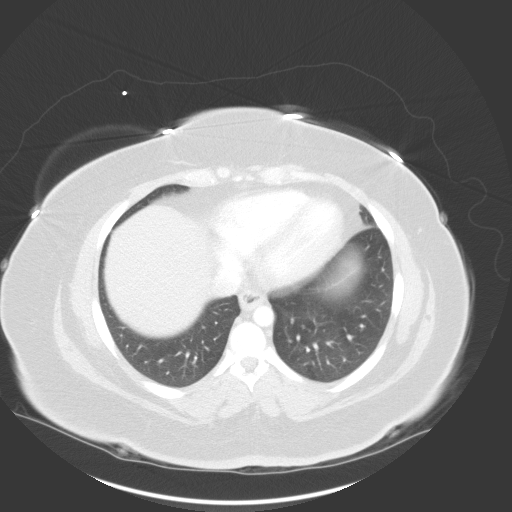

[Series 5: coronal · coronal · 1.10mm/px · 3 of 119 slices shown]
[im 30/119  soft-tissue]
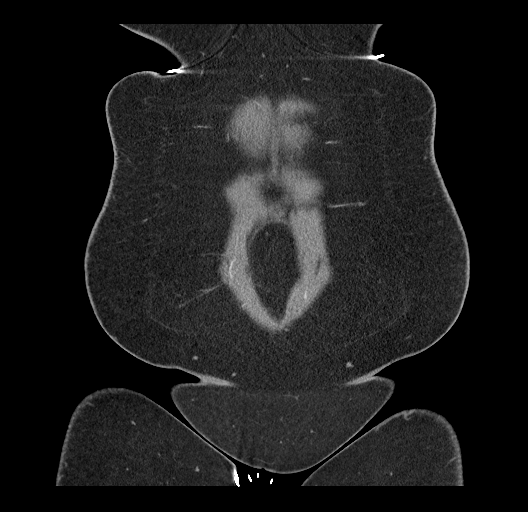
[im 60/119  soft-tissue]
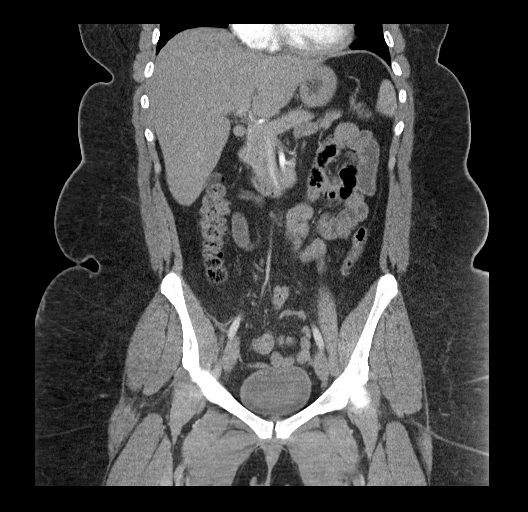
[im 89/119  soft-tissue]
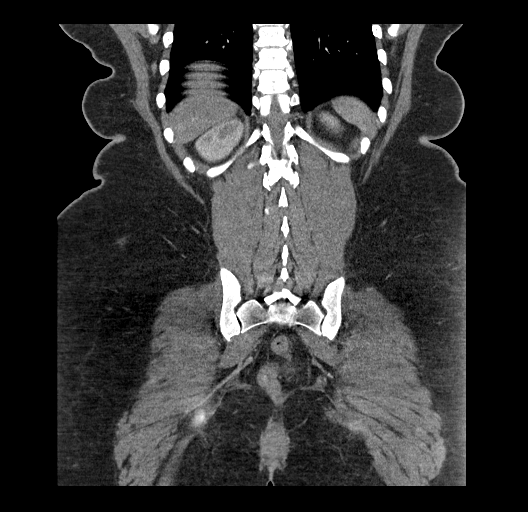

[14 of 46 positions shown; findings below may reference images not displayed]

FINDINGS: VASCULAR

Aorta: Normal caliber aorta without aneurysm, dissection, vasculitis
or significant stenosis.

Celiac: Patent without evidence of aneurysm, dissection, vasculitis
or significant stenosis.

SMA: Patent without evidence of aneurysm, dissection, vasculitis or
significant stenosis.

Renals: Normal appearance of the left renal artery. Short segment of
distal right renal artery just before the renal hilum where there is
mild wall thickening and haziness with narrowing of the luminal
diameter suggestive of vasculitis. No signs of dissection.

IMA: Patent without evidence of aneurysm, dissection, vasculitis or
significant stenosis.

Inflow: Patent without evidence of aneurysm, dissection, vasculitis
or significant stenosis.

Proximal Outflow: Bilateral common femoral and visualized portions
of the superficial and profunda femoral arteries are patent without
evidence of aneurysm, dissection, vasculitis or significant
stenosis.

Veins: No obvious venous abnormality within the limitations of this
arterial phase study.

Review of the MIP images confirms the above findings.

NON-VASCULAR

Lower chest: No acute abnormality.

Hepatobiliary: No focal liver abnormality is seen. No gallstones,
gallbladder wall thickening, or biliary dilatation.

Pancreas: Unremarkable. No pancreatic ductal dilatation or
surrounding inflammatory changes.

Spleen: Normal in size without focal abnormality.

Adrenals/Urinary Tract: Adrenal glands are unremarkable. Kidneys are
normal, without renal calculi, focal lesion, or hydronephrosis.
Bladder is unremarkable.

Stomach/Bowel: Stomach is within normal limits. Appendix appears
normal. No evidence of bowel wall thickening, distention, or
inflammatory changes.

Lymphatic: No significant vascular findings are present. No enlarged
abdominal or pelvic lymph nodes.

Reproductive: Cyst within the left ovary measures 2.8 cm. Uterus is
unremarkable.

Other: No free fluid or fluid collections.

Musculoskeletal: No acute or significant osseous findings.
IMPRESSION: 1. No evidence for renal artery dissection.
2. Short segment of distal right renal artery just before the renal
hilum where there is mild wall thickening and haziness with
narrowing of the luminal diameter suggestive of vasculitis.
3. Both kidneys appear normal without signs of infarct.

## 2021-11-18 ENCOUNTER — Other Ambulatory Visit: Payer: Self-pay

## 2021-11-18 ENCOUNTER — Encounter (HOSPITAL_COMMUNITY): Payer: Self-pay | Admitting: Emergency Medicine

## 2021-11-18 ENCOUNTER — Emergency Department (HOSPITAL_COMMUNITY)
Admission: EM | Admit: 2021-11-18 | Discharge: 2021-11-18 | Disposition: A | Discharge status: Home / Self Care | Payer: BC Managed Care – PPO | Attending: Emergency Medicine | Admitting: Emergency Medicine

## 2021-11-18 DIAGNOSIS — L509 Urticaria, unspecified: Secondary | ICD-10-CM | POA: Diagnosis not present

## 2021-11-18 DIAGNOSIS — T782XXA Anaphylactic shock, unspecified, initial encounter: Secondary | ICD-10-CM | POA: Diagnosis not present

## 2021-11-18 MED ORDER — EPINEPHRINE 0.3 MG/0.3ML IJ SOAJ
0.3000 mg | Freq: Once | INTRAMUSCULAR | Status: AC
  Administered 2021-11-18: 0.3 mg via INTRAMUSCULAR
  Filled 2021-11-18: qty 0.3

## 2021-11-18 MED ORDER — FAMOTIDINE IN NACL 20-0.9 MG/50ML-% IV SOLN
20.0000 mg | Freq: Once | INTRAVENOUS | Status: AC
  Administered 2021-11-18: 20 mg via INTRAVENOUS
  Filled 2021-11-18: qty 50

## 2021-11-18 MED ORDER — DIPHENHYDRAMINE HCL 25 MG PO TABS: 25.0000 mg | ORAL_TABLET | Freq: Four times a day (QID) | ORAL | 0 refills | Status: AC | PRN

## 2021-11-18 MED ORDER — METHYLPREDNISOLONE SODIUM SUCC 125 MG IJ SOLR
125.0000 mg | INTRAMUSCULAR | Status: AC
  Administered 2021-11-18: 125 mg via INTRAVENOUS
  Filled 2021-11-18: qty 2

## 2021-11-18 MED ORDER — EPINEPHRINE 0.3 MG/0.3ML IJ SOAJ: 0.3000 mg | INTRAMUSCULAR | 0 refills | Status: AC | PRN

## 2021-11-18 MED ORDER — PREDNISONE 10 MG PO TABS: 40.0000 mg | ORAL_TABLET | Freq: Every day | ORAL | 0 refills | Status: AC

## 2021-11-18 NOTE — ED Provider Notes (Signed)
Care assumed from [facility] at shift change, please see their note for full detail, but in brief Margarete Horace is a 26 y.o. female presents with allergic reaction.   Physical Exam  BP (!) 140/91    Pulse 73    Temp 98.4 F (36.9 C) (Oral)    Resp 16    Ht 5\' 5"  (1.651 m)    Wt (!) 146 kg    SpO2 100%    BMI 53.56 kg/m   Physical Exam  Procedures  Procedures  ED Course / MDM   Clinical Course as of 11/18/21 1654  Wed Nov 18, 2021  1425 4:20 will be able to discharge [WF]    Clinical Course User Index [WF] Nov 20, 2021, Gailen Shelter   Medical Decision Making Risk OTC drugs. Prescription drug management.   Patient is feeling well with no resurgence of symptoms after 4-hour recheck.  She is stable for discharge.  She has allergy referral.  Return precautions were discussed.  Patient discharged home in stable condition.       [place], Janell Quiet 11/18/21 1655    01/16/22, MD 11/21/21 [phone]

## 2021-11-18 NOTE — ED Provider Notes (Addendum)
[facility] DEPT Provider Note   CSN: [id number] Arrival date & time: 11/18/21  1156     History  No chief complaint on file.   Ruth Evans is a 26 y.o. female.  HPI Patient is a 26 year old female she has a past medical history significant for severe allergic reactions to various ERs.  She says that around 11:30 AM this morning she was using front and started to have hives on her face and neck and chest.  She noticed she was having some trouble swallowing and felt that her voice was hoarse and felt somewhat short of breath.  She took 50 mg of Benadryl and came to the ER for evaluation   She denies any pain at this time.  She also used an albuterol inhaler prior to arrival.  She did not use her epinephrine pen    Home Medications Prior to Admission medications   Medication Sig Start Date End Date Taking? Authorizing Provider  albuterol (PROVENTIL HFA;VENTOLIN HFA) 108 (90 BASE) MCG/ACT inhaler Inhale 2 puffs into the lungs every 6 (six) hours as needed for wheezing or shortness of breath.   Yes [provider]  aspirin-acetaminophen-caffeine (EXCEDRIN MIGRAINE) [phone] MG tablet Take 2 tablets by mouth every 6 (six) hours as needed for headache.   Yes [provider]  diphenhydrAMINE (BENADRYL) 25 MG tablet Take 1 tablet (25 mg total) by mouth every 6 (six) hours as needed. 11/18/21  Yes Ynez Eugenio S, PA  EPINEPHrine (EPIPEN 2-PAK) 0.3 mg/0.3 mL IJ SOAJ injection Inject 0.3 mg into the muscle as needed for anaphylaxis. 11/18/21  Yes Brizeida Mcmurry S, PA  predniSONE (DELTASONE) 10 MG tablet Take 4 tablets (40 mg total) by mouth daily for 4 days. 11/18/21 11/22/21 Yes Filiberto Wamble, Kathleene Hazel, PA      Allergies    Tolmetin, Codeine, Nsaids, and Pecan nut (diagnostic)    Review of Systems   Review of Systems  Physical Exam Updated Vital Signs BP 98/78    Pulse 74    Temp 98.4 F (36.9 C) (Oral)    Resp 14    Ht '5\' 5"'  (1.651 m)    Wt (!)  146 kg    SpO2 100%    BMI 53.56 kg/m  Physical Exam Vitals and nursing note reviewed.  Constitutional:      General: She is not in acute distress.    Appearance: She is obese.  HENT:     Head: Normocephalic and atraumatic.     Nose: Nose normal.  Eyes:     General: No scleral icterus. Cardiovascular:     Rate and Rhythm: Normal rate and regular rhythm.     Pulses: Normal pulses.     Heart sounds: Normal heart sounds.  Pulmonary:     Effort: Pulmonary effort is normal. No respiratory distress.     Breath sounds: Wheezing present.     Comments: Wheezing present Abdominal:     Palpations: Abdomen is soft.     Tenderness: There is no abdominal tenderness.  Musculoskeletal:     Cervical back: Normal range of motion.     Right lower leg: No edema.     Left lower leg: No edema.  Skin:    General: Skin is warm and dry.     Capillary Refill: Capillary refill takes less than 2 seconds.     Comments: Diffuse hives to face right side cheeks and chest and neck  Neurological:     Mental Status: She is  alert. Mental status is at baseline.  Psychiatric:        Mood and Affect: Mood normal.        Behavior: Behavior normal.    ED Results / Procedures / Treatments   Labs (all labs ordered are listed, but only abnormal results are displayed) Labs Reviewed - No data to display  EKG None  Radiology No results found.  Procedures .Critical Care Performed by: Tedd Sias, PA Authorized by: Tedd Sias, PA   Critical care provider statement:    Critical care time (minutes):  35   Critical care time was exclusive of:  Separately billable procedures and treating other patients and teaching time   Critical care was necessary to treat or prevent imminent or life-threatening deterioration of the following conditions: anaphylaxis.   Critical care was time spent personally by me on the following activities:  Development of treatment plan with patient or surrogate, review of old  charts, re-evaluation of patient's condition, pulse oximetry, ordering and review of radiographic studies, ordering and review of laboratory studies, ordering and performing treatments and interventions, obtaining history from patient or surrogate, examination of patient and evaluation of patient's response to treatment   Care discussed with: admitting provider      Medications Ordered in ED Medications  famotidine (PEPCID) IVPB 20 mg premix (0 mg Intravenous Stopped 11/18/21 1246)  methylPREDNISolone sodium succinate (SOLU-MEDROL) 125 mg/2 mL injection 125 mg (125 mg Intravenous Given 11/18/21 1227)  EPINEPHrine (EPI-PEN) injection 0.3 mg (0.3 mg Intramuscular Given 11/18/21 1221)    ED Course/ Medical Decision Making/ A&P Clinical Course as of 11/18/21 1512  Wed Nov 18, 2021  1425 4:20 will be able to discharge [WF]    Clinical Course User Index [WF] Tedd Sias, PA                           Medical Decision Making Risk OTC drugs. Prescription drug management.   This patient presents to the ED for concern of shortness of breath, rash, this involves a number of treatment options, and is a complaint that carries with it a high risk of complications and morbidity.  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  She says that around 11:30 AM this morning she was using front and started to have hives on her face and neck and chest.  She noticed she was having some trouble swallowing and felt that her voice was hoarse and felt somewhat short of breath.  She took 50 mg of Benadryl and came to the ER for evaluation   She denies any pain at this time.  She also used an albuterol inhaler prior to arrival.  She did not use her epinephrine pen   Wheezing on exam and diffuse hives to  system criteria met for anaphylaxis given epinephrine with significant sudden improvement.  Solu-Medrol and famotidine given via IV  EMR reviewed including pt PMHx, past surgical history and past visits to ER.   See HPI for more details   Lab Tests:  No labs needed   Imaging Studies:  No imaging studies necessary    Cardiac Monitoring:  The patient was maintained on a cardiac monitor.  I personally viewed and interpreted the cardiac monitored which showed an underlying rhythm of:  Normal sinus rhythm    Medicines ordered:  I ordered medication including epinephrine, Steroids pending famotidine for allergic/anaphylactic reaction Reevaluation of the patient after these medicines showed that the patient resolved I have reviewed the patients home medicines and have made adjustments as needed   Critical Interventions:  Epinephrine administration   Consults:      Reevaluation:  After the interventions noted above I re-evaluated patient and found that they have :resolved   Social Determinants of Health:  The patient's social determinants of health were a factor in the care of this patient    Problem List / ED Course:  Anaphylaxis   Dispostion:  After consideration of the diagnostic results and the patients response to treatment, I feel that the patent would benefit from follow-up with allergist.   Patient care handed off to [facility] for 4 hour observation. Will DC after.     Final Clinical Impression(s) / ED Diagnoses Final diagnoses:  Anaphylaxis, initial encounter    Rx / DC Orders ED Discharge Orders          Ordered    EPINEPHrine (EPIPEN 2-PAK) 0.3 mg/0.3 mL IJ SOAJ injection  As needed        11/18/21 1509    diphenhydrAMINE (BENADRYL) 25 MG tablet  Every 6 hours PRN        11/18/21 1509    predniSONE (DELTASONE) 10 MG tablet  Daily        11/18/21 1509              Tedd Sias, [place] 11/18/21 1514    Tedd Sias,  [place] 11/18/21 1515    Godfrey Pick, MD 11/21/21 1735

## 2021-11-18 NOTE — Discharge Instructions (Addendum)
You had an anaphylactic reaction today.  You were given epinephrine.  Please take Benadryl 25 mg every 6 hours for the first 24 hours and then as needed for any itching after that or hives.  Use prednisone first dose tomorrow morning as prescribed for the next 4 days.  Drink plenty of water.  If you have any difficulty breathing or recurrence of the symptoms you had today with wheezing or throat closing administered epinephrine pen as we discussed and come immediately to the emergency room.  Minimize return to ER for any new or concerning symptoms otherwise please follow-up with the allergist I have given you the information for a different allergist

## 2021-11-18 NOTE — ED Triage Notes (Signed)
States she was eating shrimp cocktail around 11:30am today and started to feel her throat itch, hives and chest tightness. Took 2 Benadryl at home, did not give her home epi pen because it is expired.

## 2022-10-12 ENCOUNTER — Emergency Department (HOSPITAL_COMMUNITY)
Admission: EM | Admit: 2022-10-12 | Discharge: 2022-10-13 | Disposition: A | Discharge status: Home / Self Care | Payer: BC Managed Care – PPO | Attending: Emergency Medicine | Admitting: Emergency Medicine

## 2022-10-12 ENCOUNTER — Encounter (HOSPITAL_COMMUNITY): Payer: Self-pay

## 2022-10-12 ENCOUNTER — Other Ambulatory Visit: Payer: Self-pay

## 2022-10-12 ENCOUNTER — Emergency Department (HOSPITAL_COMMUNITY): Payer: BC Managed Care – PPO

## 2022-10-12 DIAGNOSIS — N390 Urinary tract infection, site not specified: Secondary | ICD-10-CM | POA: Diagnosis not present

## 2022-10-12 DIAGNOSIS — R0789 Other chest pain: Secondary | ICD-10-CM | POA: Diagnosis not present

## 2022-10-12 DIAGNOSIS — R079 Chest pain, unspecified: Secondary | ICD-10-CM | POA: Insufficient documentation

## 2022-10-12 DIAGNOSIS — R0602 Shortness of breath: Secondary | ICD-10-CM | POA: Diagnosis not present

## 2022-10-12 DIAGNOSIS — B9689 Other specified bacterial agents as the cause of diseases classified elsewhere: Secondary | ICD-10-CM | POA: Diagnosis not present

## 2022-10-12 DIAGNOSIS — M545 Low back pain, unspecified: Secondary | ICD-10-CM | POA: Diagnosis not present

## 2022-10-12 DIAGNOSIS — R1010 Upper abdominal pain, unspecified: Secondary | ICD-10-CM | POA: Diagnosis not present

## 2022-10-12 DIAGNOSIS — M549 Dorsalgia, unspecified: Secondary | ICD-10-CM | POA: Diagnosis not present

## 2022-10-12 DIAGNOSIS — R109 Unspecified abdominal pain: Secondary | ICD-10-CM | POA: Diagnosis not present

## 2022-10-12 HISTORY — DX: Unspecified ovarian cyst, unspecified side: N83.209

## 2022-10-12 LAB — URINALYSIS, ROUTINE W REFLEX MICROSCOPIC
Bilirubin Urine: NEGATIVE
Glucose, UA: NEGATIVE mg/dL
Ketones, ur: NEGATIVE mg/dL
Nitrite: NEGATIVE
Protein, ur: NEGATIVE mg/dL
Specific Gravity, Urine: 1.013 (ref 1.005–1.030)
pH: 5 (ref 5.0–8.0)

## 2022-10-12 LAB — CBC WITH DIFFERENTIAL/PLATELET
Abs Immature Granulocytes: 0.03 10*3/uL (ref 0.00–0.07)
Basophils Absolute: 0 10*3/uL (ref 0.0–0.1)
Basophils Relative: 0 %
Eosinophils Absolute: 0.5 10*3/uL (ref 0.0–0.5)
Eosinophils Relative: 5 %
HCT: 42.6 % (ref 36.0–46.0)
Hemoglobin: 14 g/dL (ref 12.0–15.0)
Immature Granulocytes: 0 %
Lymphocytes Relative: 35 %
Lymphs Abs: 3.8 10*3/uL (ref 0.7–4.0)
MCH: 30.2 pg (ref 26.0–34.0)
MCHC: 32.9 g/dL (ref 30.0–36.0)
MCV: 92 fL (ref 80.0–100.0)
Monocytes Absolute: 0.9 10*3/uL (ref 0.1–1.0)
Monocytes Relative: 9 %
Neutro Abs: 5.5 10*3/uL (ref 1.7–7.7)
Neutrophils Relative %: 51 %
Platelets: 301 10*3/uL (ref 150–400)
RBC: 4.63 MIL/uL (ref 3.87–5.11)
RDW: 12 % (ref 11.5–15.5)
WBC: 10.8 10*3/uL — ABNORMAL HIGH (ref 4.0–10.5)
nRBC: 0 % (ref 0.0–0.2)

## 2022-10-12 LAB — PREGNANCY, URINE: Preg Test, Ur: NEGATIVE

## 2022-10-12 LAB — COMPREHENSIVE METABOLIC PANEL
ALT: 20 U/L (ref 0–44)
AST: 25 U/L (ref 15–41)
Albumin: 4.1 g/dL (ref 3.5–5.0)
Alkaline Phosphatase: 51 U/L (ref 38–126)
Anion gap: 11 (ref 5–15)
BUN: 17 mg/dL (ref 6–20)
CO2: 24 mmol/L (ref 22–32)
Calcium: 9 mg/dL (ref 8.9–10.3)
Chloride: 103 mmol/L (ref 98–111)
Creatinine, Ser: 0.8 mg/dL (ref 0.44–1.00)
GFR, Estimated: >60 mL/min (ref >60)
Glucose, Bld: 86 mg/dL (ref 70–99)
Potassium: 4.6 mmol/L (ref 3.5–5.1)
Sodium: 138 mmol/L (ref 135–145)
Total Bilirubin: 0.7 mg/dL (ref 0.3–1.2)
Total Protein: 7.9 g/dL (ref 6.5–8.1)

## 2022-10-12 NOTE — ED Provider Triage Note (Signed)
Emergency Medicine Provider Triage Evaluation Note  Ruth Evans , a 27 y.o. female  was evaluated in triage.  Pt complains of left sided abdominal pain and left flank pain   Review of Systems  Positive: Left flank pain  Negative: fever  Physical Exam  BP (!) 176/96 (BP Location: Left Arm)   Pulse 66   Temp (!) 97.5 F (36.4 C) (Oral)   Resp 18   Ht 5\' 5"  (1.651 m)   Wt (!) 158.3 kg   SpO2 100%   BMI 58.08 kg/m  Gen:   Awake, no distress   Resp:  Normal effort  MSK:   Moves extremities without difficulty  Other:    Medical Decision Making  Medically screening exam initiated at 5:15 PM.  Appropriate orders placed.  Ruth Evans was informed that the remainder of the evaluation will be completed by another provider, this initial triage assessment does not replace that evaluation, and the importance of remaining in the ED until their evaluation is complete.     Ruth Evans, [place] 10/12/22 [phone]

## 2022-10-12 NOTE — ED Triage Notes (Signed)
Patient reports that she began having upper abdominal pain and gas around 1800 yesterday. Patient states she took Gas-x and Tums with no relief. Patient states the pain moved from the abdomen to the lower back .  Patient states this AM around 100 she began having lower mid chest pain and upper abdominal pain and SOB. Patient states, "I've been burping a lot."

## 2022-10-13 MED ORDER — FAMOTIDINE 20 MG PO TABS: 20.0000 mg | ORAL_TABLET | Freq: Two times a day (BID) | ORAL | 0 refills | Status: DC

## 2022-10-13 MED ORDER — CEPHALEXIN 500 MG PO CAPS: 500.0000 mg | ORAL_CAPSULE | Freq: Four times a day (QID) | ORAL | 0 refills | Status: DC

## 2022-10-13 NOTE — Discharge Instructions (Addendum)
You are seen today in the emergency department for left flank pain.  As discussed suspect you have a urinary tract infection, this should improve with the antibiotic.  Take 1000 mg twice a day for the next 5 days.  Regarding the burning in your chest, take the Pepcid twice daily for the next few weeks.  Follow-up with your doctor regarding your workup today.  In the emergency department the next 2 days.  If you have constant chest pain, shortness of breath or new or concerning symptoms you should return back to the ED for additional evaluation.

## 2022-10-13 NOTE — ED Provider Notes (Signed)
[facility] DEPT Provider Note   CSN: [id number] Arrival date & time: 10/12/22  1525     History  Chief Complaint  Patient presents with   Chest Pain   Back Pain   Gastroesophageal Reflux   Shortness of Breath    Ruth Evans is a 27 y.o. female.   Chest Pain Associated symptoms: back pain and shortness of breath   Back Pain Associated symptoms: chest pain   Gastroesophageal Reflux Associated symptoms include chest pain and shortness of breath.  Shortness of Breath Associated symptoms: chest pain      Patient with medical history of endometriosis and ovarian cysts presents to the emergency department due to left flank pain.  Patient states yesterday evening she had upper abdominal discomfort and felt to felt taste in her mouth.  States it felt like acid reflux, she started having pain to the left flank since then which is constant.  Denies any frank dysuria or hematuria but has been having increased urinary frequency.  No previous abdominal surgeries, no history of nephrolithiasis.  Patient denies feeling any chest pain or shortness of breath to me.  No history of ACS, she has a Nexplanon but not on any oral birth control.  Home Medications Prior to Admission medications   Medication Sig Start Date End Date Taking? Authorizing Provider  cephALEXin (KEFLEX) 500 MG capsule Take 1 capsule (500 mg total) by mouth 4 (four) times daily. 10/13/22  Yes Sherrill Raring, PA-C  famotidine (PEPCID) 20 MG tablet Take 1 tablet (20 mg total) by mouth 2 (two) times daily. 10/13/22  Yes Sherrill Raring, PA-C  albuterol (PROVENTIL HFA;VENTOLIN HFA) 108 (90 BASE) MCG/ACT inhaler Inhale 2 puffs into the lungs every 6 (six) hours as needed for wheezing or shortness of breath.    [provider]  aspirin-acetaminophen-caffeine (EXCEDRIN MIGRAINE) [phone] MG tablet Take 2 tablets by mouth every 6 (six) hours as needed for headache.    [provider]   diphenhydrAMINE (BENADRYL) 25 MG tablet Take 1 tablet (25 mg total) by mouth every 6 (six) hours as needed. 11/18/21   Tedd Sias, PA  EPINEPHrine (EPIPEN 2-PAK) 0.3 mg/0.3 mL IJ SOAJ injection Inject 0.3 mg into the muscle as needed for anaphylaxis. 11/18/21   Tedd Sias, PA      Allergies    Tolmetin, Codeine, Nsaids, and Pecan nut (diagnostic)    Review of Systems   Review of Systems  Respiratory:  Positive for shortness of breath.   Cardiovascular:  Positive for chest pain.  Musculoskeletal:  Positive for back pain.    Physical Exam Updated Vital Signs BP 135/81   Pulse 66   Temp 97.8 F (36.6 C) (Oral)   Resp 18   Ht 5\' 5"  (1.651 m)   Wt (!) 158.3 kg   SpO2 100%   BMI 58.08 kg/m  Physical Exam Vitals and nursing note reviewed. Exam conducted with a chaperone present.  Constitutional:      Appearance: Normal appearance. She is obese.  HENT:     Head: Normocephalic and atraumatic.  Eyes:     General: No scleral icterus.       Right eye: No discharge.        Left eye: No discharge.     Extraocular Movements: Extraocular movements intact.     Pupils: Pupils are equal, round, and reactive to light.  Cardiovascular:     Rate and Rhythm: Normal rate and regular rhythm.     Pulses:  Normal pulses.     Heart sounds: Normal heart sounds. No murmur heard.    No friction rub. No gallop.  Pulmonary:     Effort: Pulmonary effort is normal. No respiratory distress.     Breath sounds: Normal breath sounds.  Abdominal:     General: Abdomen is flat. Bowel sounds are normal. There is no distension.     Palpations: Abdomen is soft.     Tenderness: There is no abdominal tenderness. There is left CVA tenderness.  Skin:    General: Skin is warm and dry.     Coloration: Skin is not jaundiced.  Neurological:     Mental Status: She is alert. Mental status is at baseline.     Coordination: Coordination normal.     ED Results / Procedures / Treatments   Labs (all labs  ordered are listed, but only abnormal results are displayed) Labs Reviewed  CBC WITH DIFFERENTIAL/PLATELET - Abnormal; Notable for the following components:      Result Value   WBC 10.8 (*)    All other components within normal limits  URINALYSIS, ROUTINE W REFLEX MICROSCOPIC - Abnormal; Notable for the following components:   APPearance HAZY (*)    Hgb urine dipstick LARGE (*)    Leukocytes,Ua LARGE (*)    Bacteria, UA FEW (*)    All other components within normal limits  COMPREHENSIVE METABOLIC PANEL  PREGNANCY, URINE    EKG None  Radiology CT Renal Stone Study  Result Date: 10/12/2022 CLINICAL DATA:  Upper abdominal pain and gas starting yesterday. EXAM: CT ABDOMEN AND PELVIS WITHOUT CONTRAST TECHNIQUE: Multidetector CT imaging of the abdomen and pelvis was performed following the standard protocol without IV contrast. RADIATION DOSE REDUCTION: This exam was performed according to the departmental dose-optimization program which includes automated exposure control, adjustment of the mA and/or kV according to patient size and/or use of iterative reconstruction technique. COMPARISON:  CTA abdomen/pelvis 07/09/2021 FINDINGS: Lower chest: The lung bases are clear. The imaged heart is unremarkable. Hepatobiliary: The liver and gallbladder are unremarkable. There is no biliary ductal dilatation. Pancreas: Unremarkable. Spleen: Unremarkable. Adrenals/Urinary Tract: The adrenals are unremarkable. The kidneys are unremarkable, with no focal lesion, stone, hydronephrosis, or hydroureter. The bladder is decompressed but grossly unremarkable. Stomach/Bowel: The stomach is unremarkable. There is no evidence of bowel obstruction. There is no abnormal bowel wall thickening or inflammatory change. The appendix is normal. Vascular/Lymphatic: The abdominal aorta is normal in course and caliber. There is no abdominal or pelvic lymphadenopathy. Reproductive: The uterus and adnexa are unremarkable. Other: There  is no ascites or free air. Musculoskeletal: There is no acute osseous abnormality or suspicious osseous lesion. IMPRESSION: No acute finding in the abdomen or pelvis. Electronically Signed   By: Valetta Mole M.D.   On: 10/12/2022 19:13    Procedures Procedures    Medications Ordered in ED Medications - No data to display  ED Course/ Medical Decision Making/ A&P                           Medical Decision Making Risk Prescription drug management.   Patient presents with left leg pain.  Differential includes but not limited to nephrolithiasis, pyelonephritis, UTI, atypical ACS, cholecystitis, GERD.  Patient has no upper abdominal tenderness or chest wall tenderness on exam.  Upper and lower extremity pulses are symmetric bilaterally.  There is no right upper quadrant tenderness and no Murphy sign.  Doubt cholecystitis.  The burning  in her chest and palpitations seem most consistent with GERD.  Given she is here mostly for the left leg pain I think that is the most likely etiology of her symptoms.  Will check urine, labs and proceed with CT renal study to evaluate for nephrolithiasis.    I ordered, viewed and interpreted laboratory workup. Mild leukocytosis with a white count of 10.8.   BMP without any gross electrolyte derangement or AKI.  Large amount of leukocyturia on UA, no hematuria.   Patient is not pregnant, not an ectopic.    CT renal stone study is negative for nephrolithiasis.  No stranding.    I reevaluate the patient.  Pain is controlled.  There has been no recurrence of the burning in her chest, I do not think is ACS specially given age and low risk factors.  She is not hypoxic or tachycardic, pain is not pleuritic in nature and does not sound like a PE.    Suspect patient symptoms are secondary to a UTI versus early pyelonephritis.  Will cover with an antibiotic.  Given she is not septic, no SIRS criteria and tolerating p.o. I think she is reasonable for outpatient trial of  antibiotics.  Will discharge at this time.        Final Clinical Impression(s) / ED Diagnoses Final diagnoses:  Urinary tract infection without hematuria, site unspecified    Rx / DC Orders ED Discharge Orders          Ordered    cephALEXin (KEFLEX) 500 MG capsule  4 times daily        10/13/22 0756    famotidine (PEPCID) 20 MG tablet  2 times daily        10/13/22 0756              Sherrill Raring, PA-C 10/13/22 0931    Jeanell Sparrow, DO 10/15/22 0800

## 2022-10-19 NOTE — Progress Notes (Signed)
   New Patient Visit  There were no vitals taken for this visit.   Subjective:    Patient ID: Ruth Evans, female    DOB: 1996/08/30, 27 y.o.   MRN: [id number]  CC: No chief complaint on file.   HPI: Ruth Evans is a 27 y.o. female presents for new patient visit to establish care.  Introduced to Designer, jewellery role and practice setting.  All questions answered.  Discussed provider/patient relationship and expectations.   Past Medical History:  Diagnosis Date   Endometriosis    Ovarian cyst     Past Surgical History:  Procedure Laterality Date   FOOT SURGERY Left 2011   bone shving   NASAL SEPTUM SURGERY     WISDOM TOOTH EXTRACTION      Family History  Problem Relation Age of Onset   Hypertension Mother    Macular degeneration Father    Cancer Maternal Grandfather        THYROID AND SKIN   Breast cancer Paternal Grandmother    Hypertension Paternal Grandfather    Heart disease Paternal Grandfather    Diabetes Maternal Aunt      Social History   Tobacco Use   Smoking status: Never   Smokeless tobacco: Never  Vaping Use   Vaping Use: Never used  Substance Use Topics   Alcohol use: No    Alcohol/week: 0.0 standard drinks of alcohol   Drug use: Not Currently    Types: Marijuana    Current Outpatient Medications on File Prior to Visit  Medication Sig Dispense Refill   albuterol (PROVENTIL HFA;VENTOLIN HFA) 108 (90 BASE) MCG/ACT inhaler Inhale 2 puffs into the lungs every 6 (six) hours as needed for wheezing or shortness of breath.     aspirin-acetaminophen-caffeine (EXCEDRIN MIGRAINE) 250-250-65 MG tablet Take 2 tablets by mouth every 6 (six) hours as needed for headache.     cephALEXin (KEFLEX) 500 MG capsule Take 1 capsule (500 mg total) by mouth 4 (four) times daily. 20 capsule 0   diphenhydrAMINE (BENADRYL) 25 MG tablet Take 1 tablet (25 mg total) by mouth every 6 (six) hours as needed. 30 tablet 0   EPINEPHrine (EPIPEN 2-PAK) 0.3 mg/0.3 mL IJ SOAJ  injection Inject 0.3 mg into the muscle as needed for anaphylaxis. 1 each 0   famotidine (PEPCID) 20 MG tablet Take 1 tablet (20 mg total) by mouth 2 (two) times daily. 30 tablet 0   No current facility-administered medications on file prior to visit.     Review of Systems      Objective:    There were no vitals taken for this visit.  Wt Readings from Last 3 Encounters:  10/12/22 (!) 349 lb (158.3 kg)  11/18/21 (!) 321 lb 14 oz (146 kg)  07/09/21 (!) 320 lb (145.2 kg)    BP Readings from Last 3 Encounters:  10/13/22 135/81  11/18/21 (!) 140/91  07/09/21 131/77    Physical Exam     Assessment & Plan:   Problem List Items Addressed This Visit   None    Follow up plan: No follow-ups on file.

## 2022-10-20 ENCOUNTER — Encounter: Payer: Self-pay | Admitting: Nurse Practitioner

## 2022-10-20 ENCOUNTER — Ambulatory Visit: Payer: BC Managed Care – PPO | Admitting: Nurse Practitioner

## 2022-10-20 VITALS — BP 126/80 | HR 87 | Temp 96.4°F | Ht 65.5 in | Wt 352.8 lb

## 2022-10-20 DIAGNOSIS — F419 Anxiety disorder, unspecified: Secondary | ICD-10-CM

## 2022-10-20 DIAGNOSIS — R197 Diarrhea, unspecified: Secondary | ICD-10-CM

## 2022-10-20 DIAGNOSIS — E559 Vitamin D deficiency, unspecified: Secondary | ICD-10-CM

## 2022-10-20 DIAGNOSIS — N926 Irregular menstruation, unspecified: Secondary | ICD-10-CM | POA: Diagnosis not present

## 2022-10-20 DIAGNOSIS — Z975 Presence of (intrauterine) contraceptive device: Secondary | ICD-10-CM

## 2022-10-20 DIAGNOSIS — Z13220 Encounter for screening for lipoid disorders: Secondary | ICD-10-CM | POA: Diagnosis not present

## 2022-10-20 DIAGNOSIS — Z23 Encounter for immunization: Secondary | ICD-10-CM

## 2022-10-20 DIAGNOSIS — R5383 Other fatigue: Secondary | ICD-10-CM | POA: Diagnosis not present

## 2022-10-20 DIAGNOSIS — F32A Depression, unspecified: Secondary | ICD-10-CM

## 2022-10-20 MED ORDER — ALBUTEROL SULFATE HFA 108 (90 BASE) MCG/ACT IN AERS: 2.0000 | INHALATION_SPRAY | Freq: Four times a day (QID) | RESPIRATORY_TRACT | 6 refills | Status: AC | PRN

## 2022-10-20 NOTE — Patient Instructions (Signed)
It was great to see you!  We are checking your labs today and will let you know the results via mychart/phone.   You can look into imitrex or maxalt for your migraines as needed.   I will fill out your paperwork for you.   Let's follow-up in 4 weeks, sooner if you have concerns.  If a referral was placed today, you will be contacted for an appointment. Please note that routine referrals can sometimes take up to 3-4 weeks to process. Please call our office if you haven't heard anything after this time frame.  Take care,  Vance Peper, NP

## 2022-10-21 ENCOUNTER — Encounter: Payer: Self-pay | Admitting: Nurse Practitioner

## 2022-10-21 DIAGNOSIS — F419 Anxiety disorder, unspecified: Secondary | ICD-10-CM | POA: Insufficient documentation

## 2022-10-21 DIAGNOSIS — E559 Vitamin D deficiency, unspecified: Secondary | ICD-10-CM | POA: Insufficient documentation

## 2022-10-21 LAB — CBC WITH DIFFERENTIAL/PLATELET
Basophils Absolute: 0.1 10*3/uL (ref 0.0–0.1)
Basophils Relative: 0.9 % (ref 0.0–3.0)
Eosinophils Absolute: 0.2 10*3/uL (ref 0.0–0.7)
Eosinophils Relative: 2.2 % (ref 0.0–5.0)
HCT: 40 % (ref 36.0–46.0)
Hemoglobin: 13.5 g/dL (ref 12.0–15.0)
Lymphocytes Relative: 27.4 % (ref 12.0–46.0)
Lymphs Abs: 2.9 10*3/uL (ref 0.7–4.0)
MCHC: 33.8 g/dL (ref 30.0–36.0)
MCV: 90.1 fl (ref 78.0–100.0)
Monocytes Absolute: 1.1 10*3/uL — ABNORMAL HIGH (ref 0.1–1.0)
Monocytes Relative: 10 % (ref 3.0–12.0)
Neutro Abs: 6.4 10*3/uL (ref 1.4–7.7)
Neutrophils Relative %: 59.5 % (ref 43.0–77.0)
Platelets: 282 10*3/uL (ref 150.0–400.0)
RBC: 4.45 Mil/uL (ref 3.87–5.11)
RDW: 12.9 % (ref 11.5–15.5)
WBC: 10.7 10*3/uL — ABNORMAL HIGH (ref 4.0–10.5)

## 2022-10-21 LAB — LIPID PANEL
Cholesterol: 165 mg/dL (ref 0–200)
HDL: 56.2 mg/dL (ref >39.00)
LDL Cholesterol: 85 mg/dL (ref 0–99)
NonHDL: 108.65
Total CHOL/HDL Ratio: 3
Triglycerides: 120 mg/dL (ref 0.0–149.0)
VLDL: 24 mg/dL (ref 0.0–40.0)

## 2022-10-21 LAB — VITAMIN D 25 HYDROXY (VIT D DEFICIENCY, FRACTURES): VITD: 12.08 ng/mL — ABNORMAL LOW (ref 30.00–100.00)

## 2022-10-21 LAB — TSH: TSH: 1.62 u[IU]/mL (ref 0.35–5.50)

## 2022-10-21 LAB — COMPREHENSIVE METABOLIC PANEL
ALT: 22 U/L (ref 0–35)
AST: 23 U/L (ref 0–37)
Albumin: 4.3 g/dL (ref 3.5–5.2)
Alkaline Phosphatase: 54 U/L (ref 39–117)
BUN: 16 mg/dL (ref 6–23)
CO2: 29 mEq/L (ref 19–32)
Calcium: 9.5 mg/dL (ref 8.4–10.5)
Chloride: 103 mEq/L (ref 96–112)
Creatinine, Ser: 0.96 mg/dL (ref 0.40–1.20)
GFR: 80.24 mL/min (ref >60.00)
Glucose, Bld: 76 mg/dL (ref 70–99)
Potassium: 4.3 mEq/L (ref 3.5–5.1)
Sodium: 141 mEq/L (ref 135–145)
Total Bilirubin: 0.5 mg/dL (ref 0.2–1.2)
Total Protein: 7.6 g/dL (ref 6.0–8.3)

## 2022-10-21 LAB — IRON,TIBC AND FERRITIN PANEL
%SAT: 21 % (calc) (ref 16–45)
Ferritin: 35 ng/mL (ref 16–154)
Iron: 76 ug/dL (ref 40–190)
TIBC: 358 mcg/dL (calc) (ref 250–450)

## 2022-10-21 LAB — TESTOSTERONE: Testosterone: 46.05 ng/dL — ABNORMAL HIGH (ref 15.00–40.00)

## 2022-10-21 LAB — LUTEINIZING HORMONE: LH: 2.26 m[IU]/mL

## 2022-10-21 LAB — FOLLICLE STIMULATING HORMONE: FSH: 6 m[IU]/mL

## 2022-10-21 LAB — HEMOGLOBIN A1C: Hgb A1c MFr Bld: 5.8 % (ref 4.6–6.5)

## 2022-10-21 MED ORDER — VITAMIN D (ERGOCALCIFEROL) 1.25 MG (50000 UNIT) PO CAPS: 50000.0000 [IU] | ORAL_CAPSULE | ORAL | 0 refills | Status: DC

## 2022-10-21 NOTE — Assessment & Plan Note (Signed)
Nexplanon needs to be removed. [facility].

## 2022-10-21 NOTE — Assessment & Plan Note (Signed)
Will check vitamin D levels today and treat based on results. 

## 2022-10-21 NOTE — Assessment & Plan Note (Signed)
BMI 57.8. She has tried since she was 27 years old to lose weight. She has tried weight watchers, tracking, noom, and phentermine with minimal results. She has lost 15 pounds over the last 7 months with exercise and tracking calories. She is interested in weight loss surgery. She had gone through the steps for this in 2020, however the pandemic occurred and she lost her health insurance. She has reached out again to [facility] and is going to start the process for surgery again. Will fill out paperwork and fax to bariatric office. Follow-up in 4 weeks.

## 2022-10-21 NOTE — Assessment & Plan Note (Signed)
Chronic, stable. She denies SI/HI. She is not currently taking any medications. Follow-up if symptoms worsen or any concerns.

## 2022-11-11 DIAGNOSIS — E282 Polycystic ovarian syndrome: Secondary | ICD-10-CM | POA: Diagnosis not present

## 2022-11-11 DIAGNOSIS — R7303 Prediabetes: Secondary | ICD-10-CM | POA: Diagnosis not present

## 2022-11-11 DIAGNOSIS — E559 Vitamin D deficiency, unspecified: Secondary | ICD-10-CM | POA: Diagnosis not present

## 2022-11-12 ENCOUNTER — Other Ambulatory Visit (HOSPITAL_COMMUNITY): Payer: Self-pay | Admitting: General Surgery

## 2022-11-22 DIAGNOSIS — F32A Depression, unspecified: Secondary | ICD-10-CM | POA: Diagnosis not present

## 2022-11-22 DIAGNOSIS — F4310 Post-traumatic stress disorder, unspecified: Secondary | ICD-10-CM | POA: Diagnosis not present

## 2022-11-22 DIAGNOSIS — Z01818 Encounter for other preprocedural examination: Secondary | ICD-10-CM | POA: Diagnosis not present

## 2022-11-23 ENCOUNTER — Encounter: Payer: BC Managed Care – PPO | Attending: General Surgery | Admitting: Dietician

## 2022-11-23 ENCOUNTER — Encounter: Payer: Self-pay | Admitting: Dietician

## 2022-11-23 VITALS — Ht 66.0 in | Wt 348.3 lb

## 2022-11-23 DIAGNOSIS — E669 Obesity, unspecified: Secondary | ICD-10-CM | POA: Diagnosis not present

## 2022-11-23 NOTE — Progress Notes (Addendum)
Nutrition Assessment for Bariatric Surgery: Pre-Surgery Behavioral and Nutrition Intervention Program   Medical Nutrition Therapy  Appt Start Time: 8:35    End Time: 9:36  Patient was seen on 11/23/2022 for Pre-Operative Nutrition Assessment. Purpose of todays visit  enhance perioperative outcomes along with a healthy weight maintenance   Referral stated Supervised Weight Loss (SWL) visits needed: 0  Pt completed visits.   Pt has cleared nutrition requirements.   Planned surgery: RYGB   NUTRITION ASSESSMENT   Anthropometrics  Start weight at [facility]: 348.3 lbs (date: 11/23/2022)  Height: 66 in BMI: 56.22 kg/m2     Clinical   Pharmacotherapy: History of weight loss medication used: phentermine  Medical hx: obesity, asthma Medications: multivitamin, vitamin D, albutrol , nexplanon, epinephrine, biotin/collogin   Labs: vit D 12.08,  Notable signs/symptoms: none noted Any previous deficiencies? No  Evaluation of Nutritional Deficiencies: Micronutrient Nutrition Focused Physical Exam: Hair: No issues observed Eyes: No issues observed Mouth: No issues observed Neck: No issues observed Nails: No issues observed Skin: No issues observed  Lifestyle & Dietary Hx  Pt states she has been through the bariatric pre-surgery process before. Pt asked many questions and received/understood the education. Pt states she works in [employer], stating she is on her feet all day.  Pt states 3 days a week she is rebounding on trampoline for 30 minutes, gym 3 days a week resistance training, 40 minutes at gym, with 100% recommendation reached .  Current Physical Activity Recommendations state 150 minutes per week of moderate to vigorous movement including Cardio and 1-2 days of resistance activities as well as flexibility/balance activities:  Pts current physical activity: 3 days a week, rebounding on trampoline for 30 minutes, gym 3 days a week resistance training, 40 minutes at gym, with 100%  recommendation reached   Sleep Hygiene: duration and quality: fair, when stressed it is harder. 75% score with fit bit. Pt states she has a sesativtiy to melatonin, stating she does valartian root instead.  Current Patient Perceived Stress Level as stated by pt on a scale of 1-10:  1       Stress Management Techniques: hobbies (crochet)  According to the Dietary Guidelines for Americans Recommendation: equivalent 1.5-2 cups fruits per day, equivalent 2-3 cups vegetables per day and at least half all grains whole  Fruit servings per day (on average): 2, meeting 100% recommendation  Non-starchy vegetable servings per day (on average): 3, meeting 100% recommendation  Whole Grains per day (on average): 1  Number of meals missed/skipped per week out of 21: 1-2  24-Hr Dietary Recall First Meal: yogurt or boiled egg, fruit or overnight oats Snack: cheezits  Second Meal: sandwich or cottage cheese dip with vegetables, crackers Snack: nuts or fruit Third Meal: salmon, asparagus, sweet potato or soup (lemon quinoa soup) Snack:  Beverages: water, milk, fresca, hot tea  Alcoholic beverages per week: 1-2 drinks a few times a year   Estimated Energy Needs Calories: 1500  NUTRITION DIAGNOSIS  Overweight/obesity ([place]-3.3) related to past poor dietary habits and physical inactivity as evidenced by patient w/ planned RYGB surgery following dietary guidelines for continued weight loss.    NUTRITION INTERVENTION  Nutrition counseling (C-1) and education (E-2) to facilitate bariatric surgery goals.  Educated pt on micronutrient deficiencies post-surgery and behavioral/dietary strategies to start in order to mitigate that risk   Behavioral and Dietary Interventions Pre-Op Goals Reviewed with the Patient Nutrition: Healthy Eating Behaviors Switch to non-caloric, non-carbonated and non-caffeinated beverages such as  water, unsweetened  tea, Crystal Light and zero calorie beverages (aim for 64 oz. per  day) Cut out grazing between meals or at night  Find a protein shake you like Eat every 3-5 hours, while watching portion sizes        Eliminate distractions while eating (TV, computer, reading, driving, texting) Take 20-30 minutes to eat a meal  Decrease high sugar foods/decrease high fat/fried foods Eliminate alcoholic beverages Increase protein intake (eggs, fish, chicken, yogurt) before surgery Eat non starchy vegetables 2 times a day 7 days a week Eat complex carbohydrates such as whole grains and fruits   Behavioral Modification: Physical Activity Increase my usual daily activity (use stairs, park farther, etc.) Engage in _______________________  activity  _______ minutes ______ times per week  Other:    _________________________________________________________________     Problem Solving I will think about my usual eating patterns and how to tweak them How can my friends and family support me Barriers to starting my changes Learn and understand appetite verses hunger   Healthy Coping Allow for ___________ activities per week to help me manage stress Reframe negative thoughts I will keep a picture of someone or something that is my inspiration & look at it daily   Monitoring  Weigh myself once a week  Measure my progress by monitoring how my clothes fit Keep a food record of what I eat and drink for the next ________ (time period) Take pictures of what I eat and drink for the next ________ (time period) Use an app to count steps/day for the next_______ (time period) Measure my progress such as increased energy and more restful sleep Monitor your acid reflux and bowel habits, are they getting better?    Handouts Provided Include  Bariatric Surgery handouts (Nutrition Visits, Pre Surgery Behavioral Change Goals, Protein Shakes Brands to Choose From, Vitamins & Mineral Supplementation)  Learning Style & Readiness for Change Teaching method utilized: Visual, Auditory, and  hands on  Demonstrated degree of understanding via: Teach Back  Readiness Level: Preparation Barriers to learning/adherence to lifestyle change: nothing identified  RD's Notes for Next Visit     MONITORING & EVALUATION Dietary intake, weekly physical activity, body weight, and preoperative behavioral change goals   Next Steps  Pt has completed visits. No further supervised visits required/recomended  Patient is to follow up at [facility] for pre-op class when surgery is scheduled.

## 2022-11-29 ENCOUNTER — Encounter: Payer: Self-pay | Admitting: Nurse Practitioner

## 2022-11-29 ENCOUNTER — Ambulatory Visit: Payer: BC Managed Care – PPO | Admitting: Nurse Practitioner

## 2022-11-29 ENCOUNTER — Other Ambulatory Visit (HOSPITAL_COMMUNITY)
Admission: RE | Admit: 2022-11-29 | Discharge: 2022-11-29 | Disposition: A | Discharge status: Home / Self Care | Payer: BC Managed Care – PPO | Source: Ambulatory Visit | Attending: Nurse Practitioner | Admitting: Nurse Practitioner

## 2022-11-29 ENCOUNTER — Ambulatory Visit (HOSPITAL_COMMUNITY)
Admission: RE | Admit: 2022-11-29 | Discharge: 2022-11-29 | Disposition: A | Discharge status: Home / Self Care | Payer: BC Managed Care – PPO | Source: Ambulatory Visit | Attending: General Surgery | Admitting: General Surgery

## 2022-11-29 ENCOUNTER — Other Ambulatory Visit: Payer: Self-pay

## 2022-11-29 VITALS — BP 122/88 | HR 63 | Temp 98.0°F | Ht 66.0 in | Wt 352.8 lb

## 2022-11-29 DIAGNOSIS — Z124 Encounter for screening for malignant neoplasm of cervix: Secondary | ICD-10-CM | POA: Insufficient documentation

## 2022-11-29 DIAGNOSIS — R7303 Prediabetes: Secondary | ICD-10-CM | POA: Diagnosis not present

## 2022-11-29 DIAGNOSIS — E559 Vitamin D deficiency, unspecified: Secondary | ICD-10-CM

## 2022-11-29 DIAGNOSIS — E282 Polycystic ovarian syndrome: Secondary | ICD-10-CM | POA: Diagnosis not present

## 2022-11-29 DIAGNOSIS — Z01818 Encounter for other preprocedural examination: Secondary | ICD-10-CM | POA: Diagnosis not present

## 2022-11-29 DIAGNOSIS — Z0389 Encounter for observation for other suspected diseases and conditions ruled out: Secondary | ICD-10-CM | POA: Diagnosis not present

## 2022-11-29 NOTE — Assessment & Plan Note (Signed)
Testosterone elevated, irregular menstrual periods, most consistent with PCOS.  Discussed starting metformin to help regulate her menstrual cycles, however she would like to wait until after her surgery.  Will have her follow-up in 6 months.

## 2022-11-29 NOTE — Progress Notes (Signed)
Established Patient Office Visit  Subjective   Patient ID: Ruth Evans, female    DOB: Mar 14, 1996  Age: 27 y.o. MRN: [id number]  Chief Complaint  Patient presents with   Medical Management of Chronic Issues    Discuss recent lab work, patient is fasting    HPI  Ruth Evans is here to follow-up on recent labs and weight management.  She states that she has seen the bariatric surgeon, nutritionist, and psychiatrist for evaluation for bariatric surgery.  She has an appointment this afternoon for chest x-ray, EKG, and upper GI.  She is hoping that if everything comes back normal here she will be scheduled for surgery in the next couple of months.  Her A1c was 5.8% which showed she was prediabetic.  She denies urinary frequency, changes in vision, excessive thirst.    ROS See pertinent positives and negatives per HPI.    Objective:     BP 122/88 (BP Location: Right Wrist)   Pulse 63   Temp 98 F (36.7 C)   Ht 5' 6"$  (1.676 m)   Wt (!) 352 lb 12.8 oz (160 kg)   LMP 11/25/2022   SpO2 99%   BMI 56.94 kg/m  BP Readings from Last 3 Encounters:  11/29/22 122/88  10/20/22 126/80  10/13/22 135/81   Wt Readings from Last 3 Encounters:  11/29/22 (!) 352 lb 12.8 oz (160 kg)  11/23/22 (!) 348 lb 4.8 oz (158 kg)  10/20/22 (!) 352 lb 12.8 oz (160 kg)      Physical Exam Vitals and nursing note reviewed. Exam conducted with a chaperone present.  Constitutional:      General: She is not in acute distress.    Appearance: Normal appearance.  HENT:     Head: Normocephalic.  Eyes:     Conjunctiva/sclera: Conjunctivae normal.  Cardiovascular:     Rate and Rhythm: Normal rate and regular rhythm.     Pulses: Normal pulses.     Heart sounds: Normal heart sounds.  Pulmonary:     Effort: Pulmonary effort is normal.     Breath sounds: Normal breath sounds.  Genitourinary:    General: Normal vulva.     Exam position: Lithotomy position.     Labia:        Right: No rash,  tenderness or lesion.        Left: No rash, tenderness or lesion.      Vagina: Bleeding present.     Cervix: Normal.     Uterus: Normal.   Musculoskeletal:     Cervical back: Normal range of motion.  Skin:    General: Skin is warm.  Neurological:     General: No focal deficit present.     Mental Status: She is alert and oriented to person, place, and time.  Psychiatric:        Mood and Affect: Mood normal.        Behavior: Behavior normal.        Thought Content: Thought content normal.        Judgment: Judgment normal.      Assessment & Plan:   Problem List Items Addressed This Visit       Endocrine   PCOS (polycystic ovarian syndrome)    Testosterone elevated, irregular menstrual periods, most consistent with PCOS.  Discussed starting metformin to help regulate her menstrual cycles, however she would like to wait until after her surgery.  Will have her follow-up in 6 months.  Other   Vitamin D deficiency    Vitamin D was 12 on last check.  Continue prescription vitamin D supplement for a total of 12 weeks, then start an over-the-counter supplement 2000 units daily.      Morbid obesity ([place])    She is currently doing the prerequisites for bariatric surgery.  She is hoping to have this done in the next few months.  She has her final testing for upper GI, chest x-ray, EKG today.      Prediabetes - Primary    A1c was 5.8%.  Discussed nutrition and weight loss.  She is going through the prerequisites for bariatric surgery.       Other Visit Diagnoses     Screening for cervical cancer       Pap done today   Relevant Orders   Cytology - PAP       Return in about 6 months (around 05/30/2023) for CPE.    Charyl Dancer, NP

## 2022-11-29 NOTE — Assessment & Plan Note (Signed)
Vitamin D was 12 on last check.  Continue prescription vitamin D supplement for a total of 12 weeks, then start an over-the-counter supplement 2000 units daily.

## 2022-11-29 NOTE — Assessment & Plan Note (Signed)
A1c was 5.8%.  Discussed nutrition and weight loss.  She is going through the prerequisites for bariatric surgery.

## 2022-11-29 NOTE — Patient Instructions (Signed)
It was great to see you!  I will let you know the results of your pap.  I hope everything goes well with surgery!   Let's follow-up in 6 months, sooner if you have concerns.  If a referral was placed today, you will be contacted for an appointment. Please note that routine referrals can sometimes take up to 3-4 weeks to process. Please call our office if you haven't heard anything after this time frame.  Take care,  Vance Peper, NP

## 2022-11-29 NOTE — Assessment & Plan Note (Signed)
She is currently doing the prerequisites for bariatric surgery.  She is hoping to have this done in the next few months.  She has her final testing for upper GI, chest x-ray, EKG today.

## 2022-11-30 LAB — CYTOLOGY - PAP: Diagnosis: NEGATIVE

## 2022-12-13 ENCOUNTER — Ambulatory Visit: Payer: BC Managed Care – PPO

## 2022-12-20 ENCOUNTER — Encounter: Payer: BC Managed Care – PPO | Attending: General Surgery | Admitting: Skilled Nursing Facility1

## 2022-12-20 DIAGNOSIS — E669 Obesity, unspecified: Secondary | ICD-10-CM | POA: Insufficient documentation

## 2022-12-20 DIAGNOSIS — R7303 Prediabetes: Secondary | ICD-10-CM | POA: Insufficient documentation

## 2022-12-20 NOTE — Progress Notes (Signed)
Pre-Operative Nutrition Class:    Patient was seen on 12/20/2022 for Pre-Operative Bariatric Surgery Education at [facility]    Surgery date: 01/17/2023 Surgery type: RYGB Start weight at [facility]: 348.3 Weight today: 355.5  Samples given per MNT protocol. Patient educated on appropriate usage: Ensure protein shake: June/10/2022; [id number] Procare multivitamin: 09/26; 9932 Procare calcium: 02/04/[id number]   The following the learning objectives were met by the patient during this course: Identify Pre-Op Dietary Goals and will begin 2 weeks pre-operatively Identify appropriate sources of fluids and proteins  State protein recommendations and appropriate sources pre and post-operatively Identify Post-Operative Dietary Goals and will follow for 2 weeks post-operatively Identify appropriate multivitamin and calcium sources Describe the need for physical activity post-operatively and will follow MD recommendations State when to call healthcare provider regarding medication questions or post-operative complications When having a diagnosis of diabetes understanding hypoglycemia symptoms and the inclusion of 1 complex carbohydrate per meal  Handouts given during class include: Pre-Op Bariatric Surgery Diet Handout Protein Shake Handout Post-Op Bariatric Surgery Nutrition Handout BEL[facility] Program Information Flyer Support Group Information Flyer [facility] Bariatric Supplements Price List  Follow-Up Plan: Patient will follow-up at [facility] 2 weeks post operatively for diet advancement per MD.

## 2022-12-31 DIAGNOSIS — E282 Polycystic ovarian syndrome: Secondary | ICD-10-CM | POA: Diagnosis not present

## 2022-12-31 DIAGNOSIS — R7303 Prediabetes: Secondary | ICD-10-CM | POA: Diagnosis not present

## 2022-12-31 DIAGNOSIS — E559 Vitamin D deficiency, unspecified: Secondary | ICD-10-CM | POA: Diagnosis not present

## 2023-01-03 NOTE — Progress Notes (Signed)
Surgery orders requested via Epic inbox. °

## 2023-01-06 ENCOUNTER — Ambulatory Visit: Payer: Self-pay | Admitting: General Surgery

## 2023-01-06 DIAGNOSIS — R7303 Prediabetes: Secondary | ICD-10-CM

## 2023-01-06 NOTE — Patient Instructions (Addendum)
SURGICAL WAITING ROOM VISITATION Patients having surgery or a procedure may have no more than 2 support people in the waiting area - these visitors may rotate in the visitor waiting room.   Due to an increase in RSV and influenza rates and associated hospitalizations, children ages 62 and under may not visit patients in [place]. If the patient needs to stay at the hospital during part of their recovery, the visitor guidelines for inpatient rooms apply.  PRE-OP VISITATION  Pre-op nurse will coordinate an appropriate time for 1 support person to accompany the patient in pre-op.  This support person may not rotate.  This visitor will be contacted when the time is appropriate for the visitor to come back in the pre-op area.  Please refer to the [facility] website for the visitor guidelines for Inpatients (after your surgery is over and you are in a regular room).  You are not required to quarantine at this time prior to your surgery. However, you must do this: Hand Hygiene often Do NOT share personal items Notify your provider if you are in close contact with someone who has COVID or you develop fever 100.4 or greater, new onset of sneezing, cough, sore throat, shortness of breath or body aches.  If you test positive for Covid or have been in contact with anyone that has tested positive in the last 10 days please notify you surgeon.    Your procedure is scheduled on:  Monday  January 17, 2023  Report to [facility] Main Entrance: [place] entrance where the [employer] is available.   Report to admitting at: 05:15     AM  +++++Call this number if you have any questions or problems the morning of surgery [phone]  Do not eat food after Midnight the night prior to your surgery/procedure.  After Midnight you may have the following liquids until   04:30 AM DAY OF SURGERY  Clear Liquid Diet Water Black Coffee (sugar ok, NO MILK/CREAM OR CREAMERS)  Tea (sugar ok, NO  MILK/CREAM OR CREAMERS) regular and decaf                             Plain Jell-O  with no fruit (NO RED)                                           Fruit ices (not with fruit pulp, NO RED)                                     Popsicles (NO RED)                                                                  Juice: apple, WHITE grape, WHITE cranberry Sports drinks like Gatorade or Powerade (NO RED)                     The day of surgery:  Drink ONE (1) Pre-Surgery G2 at   04:30 AM the morning of surgery. Drink  in one sitting. Do not sip.  This drink was given to you during your hospital pre-op appointment visit. Nothing else to drink after completing the Pre-Surgery G2 : No candy, chewing gum or throat lozenges.    FOLLOW BOWEL PREP AND ANY ADDITIONAL PRE OP INSTRUCTIONS YOU RECEIVED FROM YOUR SURGEON'S OFFICE!!!   Oral Hygiene is also important to reduce your risk of infection.        Remember - BRUSH YOUR TEETH THE MORNING OF SURGERY WITH YOUR REGULAR TOOTHPASTE  Do NOT smoke after Midnight the night before surgery.  Take ONLY these medicines the morning of surgery with A SIP OF WATER: None  If You have been diagnosed with Sleep Apnea - Bring CPAP mask and tubing day of surgery. We will provide you with a CPAP machine on the day of your surgery.                   You may not have any metal on your body including hair pins, jewelry, and body piercing  Do not wear make-up, lotions, powders, perfumes or deodorant  Do not wear nail polish including gel and S&S, artificial / acrylic nails, or any other type of covering on natural nails including finger and toenails. If you have artificial nails, gel coating, etc., that needs to be removed by a nail salon, Please have this removed prior to surgery. Not doing so may mean that your surgery could be cancelled or delayed if the Surgeon or anesthesia staff feels like they are unable to monitor you safely.   Do not shave 48 hours prior to  surgery to avoid nicks in your skin which may contribute to postoperative infections.   You may bring a small overnight bag with you on the day of surgery, only pack items that are not valuable. [facility] IS NOT RESPONSIBLE   FOR VALUABLES THAT ARE LOST OR STOLEN.   Do not bring your home medications to the hospital. The Pharmacy will dispense medications listed on your medication list to you during your admission in the Hospital.   Please read over the following fact sheets you were given: IF YOU HAVE QUESTIONS ABOUT YOUR [place], [place] [phone].   [facility] - Preparing for Surgery Before surgery, you can play an important role.  Because skin is not sterile, your skin needs to be as free of germs as possible.  You can reduce the number of germs on your skin by washing with CHG (chlorahexidine gluconate) soap before surgery.  CHG is an antiseptic cleaner which kills germs and bonds with the skin to continue killing germs even after washing. Please DO NOT use if you have an allergy to CHG or antibacterial soaps.  If your skin becomes reddened/irritated stop using the CHG and inform your nurse when you arrive at Short Stay. Do not shave (including legs and underarms) for at least 48 hours prior to the first CHG shower.  You may shave your face/neck.  Please follow these instructions carefully:  1.  Shower with CHG Soap the night before surgery and the  morning of surgery.  2.  If you choose to wash your hair, wash your hair first as usual with your normal  shampoo.  3.  After you shampoo, rinse your hair and body thoroughly to remove the shampoo.                             4.  Use CHG as you would any other liquid soap.  You can apply chg directly to the skin and wash.  Gently with a scrungie or clean washcloth.  5.  Apply the CHG Soap to your body ONLY FROM THE NECK DOWN.   Do not use on face/ open                           Wound or open sores. Avoid contact with eyes,  ears mouth and genitals (private parts).                       Wash face,  Genitals (private parts) with your normal soap.             6.  Wash thoroughly, paying special attention to the area where your  surgery  will be performed.  7.  Thoroughly rinse your body with warm water from the neck down.  8.  DO NOT shower/wash with your normal soap after using and rinsing off the CHG Soap.            9.  Pat yourself dry with a clean towel.            10.  Wear clean pajamas.            11.  Place clean sheets on your bed the night of your first shower and do not  sleep with pets.  ON THE DAY OF SURGERY : Do not apply any lotions/deodorants the morning of surgery.  Please wear clean clothes to the hospital/surgery center.    FAILURE TO FOLLOW THESE INSTRUCTIONS MAY RESULT IN THE CANCELLATION OF YOUR SURGERY  PATIENT SIGNATURE_________________________________  NURSE SIGNATURE__________________________________  ________________________________________________________________________      Adam Phenix    An incentive spirometer is a tool that can help keep your lungs clear and active. This tool measures how well you are filling your lungs with each breath. Taking long deep breaths may help reverse or decrease the chance of developing breathing (pulmonary) problems (especially infection) following: A long period of time when you are unable to move or be active. BEFORE THE PROCEDURE  If the spirometer includes an indicator to show your best effort, your nurse or respiratory therapist will set it to a desired goal. If possible, sit up straight or lean slightly forward. Try not to slouch. Hold the incentive spirometer in an upright position. INSTRUCTIONS FOR USE  Sit on the edge of your bed if possible, or sit up as far as you can in bed or on a chair. Hold the incentive spirometer in an upright position. Breathe out normally. Place the mouthpiece in your mouth and seal your lips  tightly around it. Breathe in slowly and as deeply as possible, raising the piston or the ball toward the top of the column. Hold your breath for 3-5 seconds or for as long as possible. Allow the piston or ball to fall to the bottom of the column. Remove the mouthpiece from your mouth and breathe out normally. Rest for a few seconds and repeat Steps 1 through 7 at least 10 times every 1-2 hours when you are awake. Take your time and take a few normal breaths between deep breaths. The spirometer may include an indicator to show your best effort. Use the indicator as a goal to work toward during each repetition. After each set of 10 deep breaths, practice coughing to be sure your lungs  are clear. If you have an incision (the cut made at the time of surgery), support your incision when coughing by placing a pillow or rolled up towels firmly against it. Once you are able to get out of bed, walk around indoors and cough well. You may stop using the incentive spirometer when instructed by your caregiver.  RISKS AND COMPLICATIONS Take your time so you do not get dizzy or light-headed. If you are in pain, you may need to take or ask for pain medication before doing incentive spirometry. It is harder to take a deep breath if you are having pain. AFTER USE Rest and breathe slowly and easily. It can be helpful to keep track of a log of your progress. Your caregiver can provide you with a simple table to help with this. If you are using the spirometer at home, follow these instructions: [place] IF:  You are having difficultly using the spirometer. You have trouble using the spirometer as often as instructed. Your pain medication is not giving enough relief while using the spirometer. You develop fever of 100.5 F (38.1 C) or higher.                                                                                                    SEEK IMMEDIATE MEDICAL CARE IF:  You cough up bloody sputum that  had not been present before. You develop fever of 102 F (38.9 C) or greater. You develop worsening pain at or near the incision site. MAKE SURE YOU:  Understand these instructions. Will watch your condition. Will get help right away if you are not doing well or get worse. Document Released: 02/07/2007 Document Revised: 12/20/2011 Document Reviewed: 04/10/2007 [facility] Patient Information 2014 [place], [place].    WHAT IS A BLOOD TRANSFUSION? Blood Transfusion Information  A transfusion is the replacement of blood or some of its parts. Blood is made up of multiple cells which provide different functions. Red blood cells carry oxygen and are used for blood loss replacement. White blood cells fight against infection. Platelets control bleeding. Plasma helps clot blood. Other blood products are available for specialized needs, such as hemophilia or other clotting disorders. BEFORE THE TRANSFUSION  Who gives blood for transfusions?  Healthy volunteers who are fully evaluated to make sure their blood is safe. This is blood bank blood. Transfusion therapy is the safest it has ever been in the practice of medicine. Before blood is taken from a donor, a complete history is taken to make sure that person has no history of diseases nor engages in risky social behavior (examples are intravenous drug use or sexual activity with multiple partners). The donor's travel history is screened to minimize risk of transmitting infections, such as malaria. The donated blood is tested for signs of infectious diseases, such as HIV and hepatitis. The blood is then tested to be sure it is compatible with you in order to minimize the chance of a transfusion reaction. If you or a relative donates blood, this is often done in anticipation of surgery and is not appropriate for  emergency situations. It takes many days to process the donated blood. RISKS AND COMPLICATIONS Although transfusion therapy is very safe and saves  many lives, the main dangers of transfusion include:  Getting an infectious disease. Developing a transfusion reaction. This is an allergic reaction to something in the blood you were given. Every precaution is taken to prevent this. The decision to have a blood transfusion has been considered carefully by your caregiver before blood is given. Blood is not given unless the benefits outweigh the risks. AFTER THE TRANSFUSION Right after receiving a blood transfusion, you will usually feel much better and more energetic. This is especially true if your red blood cells have gotten low (anemic). The transfusion raises the level of the red blood cells which carry oxygen, and this usually causes an energy increase. The nurse administering the transfusion will monitor you carefully for complications. HOME CARE INSTRUCTIONS  No special instructions are needed after a transfusion. You may find your energy is better. Speak with your caregiver about any limitations on activity for underlying diseases you may have. SEEK MEDICAL CARE IF:  Your condition is not improving after your transfusion. You develop redness or irritation at the intravenous (IV) site. SEEK IMMEDIATE MEDICAL CARE IF:  Any of the following symptoms occur over the next 12 hours: Shaking chills. You have a temperature by mouth above 102 F (38.9 C), not controlled by medicine. Chest, back, or muscle pain. People around you feel you are not acting correctly or are confused. Shortness of breath or difficulty breathing. Dizziness and fainting. You get a rash or develop hives. You have a decrease in urine output. Your urine turns a dark color or changes to pink, red, or brown. Any of the following symptoms occur over the next 10 days: You have a temperature by mouth above 102 F (38.9 C), not controlled by medicine. Shortness of breath. Weakness after normal activity. The white part of the eye turns yellow (jaundice). You have a  decrease in the amount of urine or are urinating less often. Your urine turns a dark color or changes to pink, red, or brown. Document Released: 09/24/2000 Document Revised: 12/20/2011 Document Reviewed: 05/13/2008 [facility] Patient Information 2014 [place], [place].  _______________________________________________________________________

## 2023-01-06 NOTE — Progress Notes (Addendum)
COVID Vaccine received:  []  No [x]  Yes Date of any COVID positive Test in last 46 days:None  PCP - Vance Peper, NP Cardiologist - None  Chest x-ray - 11-29-2022  Epic EKG -  11-29-2022   Epic Stress Test -  ECHO -  Cardiac Cath -   PCR screen: []  Ordered & Completed                      []   No Order but Needs PROFEND                      [x]   N/A for this surgery  Surgery Plan:  []  Ambulatory                            []  Outpatient in bed                            [x]  Admit  Anesthesia:    [x]  General  []  Spinal                           []   Choice []   MAC  Bowel Prep - []  No  [x]   Yes __Bariatric diet   Pacemaker / ICD device [x]  No []  Yes   Spinal Cord Stimulator:[x]  No []  Yes      Other Implants: Nexplanon implant - Left arm;  has expired,   History of Sleep Apnea? [x]  No []  Yes   CPAP used?- [x]  No []  Yes    Does the patient monitor blood sugar? [x]  No []  Yes  []  N/A  Patient has: [x]  Pre-DM   []  DM1  []   DM2 Does patient have a [employer] or Dexacom? [x]  No []  Yes   Fasting Blood Sugar Ranges-  Checks Blood Sugar __0_ times a day  Blood Thinner / Instructions: none Aspirin Instructions: none  ERAS Protocol Ordered: []  No  [x]  Yes PRE-SURGERY []  ENSURE  [x]  G2  Patient is to be NPO after: 0430  Activity level: Patient is able to climb a flight of stairs without difficulty; [x]  No CP  but would have some SOB.   Patient can perform ADLs without assistance.   Anesthesia review: Pre-DM, PTSD, anxiety, GERD, has expired Nexplanon implant - left arm,     Patient says she had a pericardial infection while in visiting her parents in [place] 8 years ago. She was hospitalized x 1 week down there; has had no problems with her heart since then.   Patient denies shortness of breath, fever, cough and chest pain at PAT appointment.  Patient verbalized understanding and agreement to the Pre-Surgical Instructions that were given to them at this PAT appointment.  Patient was also educated of the need to review these PAT instructions again prior to her surgery.I reviewed the appropriate phone numbers to call if they have any and questions or concerns.

## 2023-01-10 ENCOUNTER — Encounter (HOSPITAL_COMMUNITY): Payer: Self-pay

## 2023-01-10 ENCOUNTER — Other Ambulatory Visit: Payer: Self-pay

## 2023-01-10 ENCOUNTER — Encounter (HOSPITAL_COMMUNITY)
Admission: RE | Admit: 2023-01-10 | Discharge: 2023-01-10 | Disposition: A | Discharge status: Home / Self Care | Payer: BC Managed Care – PPO | Source: Ambulatory Visit | Attending: General Surgery | Admitting: General Surgery

## 2023-01-10 VITALS — BP 146/75 | HR 58 | Temp 98.5°F | Resp 20 | Ht 66.0 in | Wt 347.0 lb

## 2023-01-10 DIAGNOSIS — R7303 Prediabetes: Secondary | ICD-10-CM | POA: Insufficient documentation

## 2023-01-10 DIAGNOSIS — Z01812 Encounter for preprocedural laboratory examination: Secondary | ICD-10-CM | POA: Diagnosis not present

## 2023-01-10 HISTORY — DX: Headache, unspecified: R51.9

## 2023-01-10 HISTORY — DX: Other specified postprocedural states: Z98.890

## 2023-01-10 HISTORY — DX: Pneumonia, unspecified organism: J18.9

## 2023-01-10 HISTORY — DX: Personal history of urinary calculi: Z87.442

## 2023-01-10 HISTORY — DX: Anemia, unspecified: D64.9

## 2023-01-10 HISTORY — DX: Post-traumatic stress disorder, unspecified: F43.10

## 2023-01-10 LAB — CBC WITH DIFFERENTIAL/PLATELET
Abs Immature Granulocytes: 0.02 10*3/uL (ref 0.00–0.07)
Basophils Absolute: 0 10*3/uL (ref 0.0–0.1)
Basophils Relative: 0 %
Eosinophils Absolute: 0.4 10*3/uL (ref 0.0–0.5)
Eosinophils Relative: 5 %
HCT: 42.1 % (ref 36.0–46.0)
Hemoglobin: 13.7 g/dL (ref 12.0–15.0)
Immature Granulocytes: 0 %
Lymphocytes Relative: 33 %
Lymphs Abs: 2.6 10*3/uL (ref 0.7–4.0)
MCH: 29.8 pg (ref 26.0–34.0)
MCHC: 32.5 g/dL (ref 30.0–36.0)
MCV: 91.7 fL (ref 80.0–100.0)
Monocytes Absolute: 0.7 10*3/uL (ref 0.1–1.0)
Monocytes Relative: 9 %
Neutro Abs: 4.1 10*3/uL (ref 1.7–7.7)
Neutrophils Relative %: 53 %
Platelets: 277 10*3/uL (ref 150–400)
RBC: 4.59 MIL/uL (ref 3.87–5.11)
RDW: 12 % (ref 11.5–15.5)
WBC: 7.8 10*3/uL (ref 4.0–10.5)
nRBC: 0 % (ref 0.0–0.2)

## 2023-01-10 LAB — COMPREHENSIVE METABOLIC PANEL
ALT: 21 U/L (ref 0–44)
AST: 21 U/L (ref 15–41)
Albumin: 4.4 g/dL (ref 3.5–5.0)
Alkaline Phosphatase: 44 U/L (ref 38–126)
Anion gap: 7 (ref 5–15)
BUN: 15 mg/dL (ref 6–20)
CO2: 25 mmol/L (ref 22–32)
Calcium: 9 mg/dL (ref 8.9–10.3)
Chloride: 104 mmol/L (ref 98–111)
Creatinine, Ser: 0.7 mg/dL (ref 0.44–1.00)
GFR, Estimated: >60 mL/min (ref >60)
Glucose, Bld: 93 mg/dL (ref 70–99)
Potassium: 3.9 mmol/L (ref 3.5–5.1)
Sodium: 136 mmol/L (ref 135–145)
Total Bilirubin: 0.6 mg/dL (ref 0.3–1.2)
Total Protein: 7.7 g/dL (ref 6.5–8.1)

## 2023-01-10 LAB — GLUCOSE, CAPILLARY: Glucose-Capillary: 99 mg/dL (ref 70–99)

## 2023-01-11 LAB — HEMOGLOBIN A1C
Hgb A1c MFr Bld: 5.7 % — ABNORMAL HIGH (ref 4.8–5.6)
Mean Plasma Glucose: 117 mg/dL

## 2023-01-16 NOTE — Anesthesia Preprocedure Evaluation (Addendum)
Anesthesia Evaluation  Patient identified by MRN, date of birth, ID band Patient awake    Reviewed: Allergy & Precautions, H&P , NPO status , Patient's Chart, lab work & pertinent test results  History of Anesthesia Complications (+) PONV and history of anesthetic complications  Airway Mallampati: I  TM Distance: >3 FB Neck ROM: Full    Dental no notable dental hx. (+) Teeth Intact, Dental Advisory Given   Pulmonary neg pulmonary ROS, asthma , pneumonia   Pulmonary exam normal breath sounds clear to auscultation       Cardiovascular Exercise Tolerance: Good negative cardio ROS Normal cardiovascular exam Rhythm:Regular Rate:Normal     Neuro/Psych  Headaches PSYCHIATRIC DISORDERS Anxiety Depression    negative neurological ROS  negative psych ROS   GI/Hepatic negative GI ROS, Neg liver ROS,GERD  Medicated,,  Endo/Other  negative endocrine ROS  Morbid obesity  Renal/GU negative Renal ROS  negative genitourinary   Musculoskeletal negative musculoskeletal ROS (+)    Abdominal  (+) + obese  Peds negative pediatric ROS (+)  Hematology negative hematology ROS (+) Blood dyscrasia, anemia   Anesthesia Other Findings   Reproductive/Obstetrics negative OB ROS                             Anesthesia Physical Anesthesia Plan  ASA: 2  Anesthesia Plan: General   Post-op Pain Management: Ofirmev IV (intra-op)* and Precedex   Induction: Intravenous  PONV Risk Score and Plan: Ondansetron, Dexamethasone, Treatment may vary due to age or medical condition and Scopolamine patch - Pre-op  Airway Management Planned: Oral ETT  Additional Equipment: None  Intra-op Plan:   Post-operative Plan: Extubation in OR  Informed Consent: I have reviewed the patients History and Physical, chart, labs and discussed the procedure including the risks, benefits and alternatives for the proposed anesthesia with the  patient or authorized representative who has indicated his/her understanding and acceptance.     Dental advisory given  Plan Discussed with: Anesthesiologist and CRNA  Anesthesia Plan Comments: (  )        Anesthesia Quick Evaluation

## 2023-01-17 ENCOUNTER — Inpatient Hospital Stay (HOSPITAL_COMMUNITY): Payer: BC Managed Care – PPO | Admitting: Physician Assistant

## 2023-01-17 ENCOUNTER — Other Ambulatory Visit: Payer: Self-pay

## 2023-01-17 ENCOUNTER — Encounter (HOSPITAL_COMMUNITY)
Admission: RE | Disposition: A | Discharge status: Home / Self Care | Payer: Self-pay | Source: Home / Self Care | Attending: General Surgery

## 2023-01-17 ENCOUNTER — Inpatient Hospital Stay (HOSPITAL_COMMUNITY): Payer: BC Managed Care – PPO | Admitting: Anesthesiology

## 2023-01-17 ENCOUNTER — Inpatient Hospital Stay (HOSPITAL_COMMUNITY)
Admission: RE | Admit: 2023-01-17 | Discharge: 2023-01-19 | DRG: 621 | Disposition: A | Discharge status: Home / Self Care | Payer: BC Managed Care – PPO | Attending: General Surgery | Admitting: General Surgery

## 2023-01-17 ENCOUNTER — Encounter (HOSPITAL_COMMUNITY): Payer: Self-pay | Admitting: General Surgery

## 2023-01-17 DIAGNOSIS — R12 Heartburn: Secondary | ICD-10-CM | POA: Diagnosis not present

## 2023-01-17 DIAGNOSIS — Z9884 Bariatric surgery status: Principal | ICD-10-CM

## 2023-01-17 DIAGNOSIS — Z6841 Body Mass Index (BMI) 40.0 and over, adult: Secondary | ICD-10-CM

## 2023-01-17 DIAGNOSIS — Z885 Allergy status to narcotic agent status: Secondary | ICD-10-CM | POA: Diagnosis not present

## 2023-01-17 DIAGNOSIS — G8929 Other chronic pain: Secondary | ICD-10-CM | POA: Diagnosis not present

## 2023-01-17 DIAGNOSIS — Z91013 Allergy to seafood: Secondary | ICD-10-CM | POA: Diagnosis not present

## 2023-01-17 DIAGNOSIS — Z888 Allergy status to other drugs, medicaments and biological substances status: Secondary | ICD-10-CM

## 2023-01-17 DIAGNOSIS — Z8249 Family history of ischemic heart disease and other diseases of the circulatory system: Secondary | ICD-10-CM | POA: Diagnosis not present

## 2023-01-17 DIAGNOSIS — Z886 Allergy status to analgesic agent status: Secondary | ICD-10-CM

## 2023-01-17 DIAGNOSIS — Z91018 Allergy to other foods: Secondary | ICD-10-CM

## 2023-01-17 DIAGNOSIS — M25562 Pain in left knee: Secondary | ICD-10-CM | POA: Diagnosis present

## 2023-01-17 DIAGNOSIS — K219 Gastro-esophageal reflux disease without esophagitis: Secondary | ICD-10-CM | POA: Diagnosis present

## 2023-01-17 DIAGNOSIS — E282 Polycystic ovarian syndrome: Secondary | ICD-10-CM | POA: Diagnosis present

## 2023-01-17 DIAGNOSIS — R7303 Prediabetes: Secondary | ICD-10-CM | POA: Diagnosis present

## 2023-01-17 DIAGNOSIS — J4520 Mild intermittent asthma, uncomplicated: Secondary | ICD-10-CM | POA: Diagnosis not present

## 2023-01-17 DIAGNOSIS — E559 Vitamin D deficiency, unspecified: Secondary | ICD-10-CM | POA: Diagnosis not present

## 2023-01-17 DIAGNOSIS — Z828 Family history of other disabilities and chronic diseases leading to disablement, not elsewhere classified: Secondary | ICD-10-CM | POA: Diagnosis not present

## 2023-01-17 HISTORY — PX: GASTRIC ROUX-EN-Y: SHX5262

## 2023-01-17 LAB — CREATININE, SERUM
Creatinine, Ser: 0.85 mg/dL (ref 0.44–1.00)
GFR, Estimated: >60 mL/min (ref >60)

## 2023-01-17 LAB — CBC
HCT: 42.3 % (ref 36.0–46.0)
Hemoglobin: 13.9 g/dL (ref 12.0–15.0)
MCH: 30 pg (ref 26.0–34.0)
MCHC: 32.9 g/dL (ref 30.0–36.0)
MCV: 91.4 fL (ref 80.0–100.0)
Platelets: 295 10*3/uL (ref 150–400)
RBC: 4.63 MIL/uL (ref 3.87–5.11)
RDW: 12.1 % (ref 11.5–15.5)
WBC: 15.7 10*3/uL — ABNORMAL HIGH (ref 4.0–10.5)
nRBC: 0 % (ref 0.0–0.2)

## 2023-01-17 LAB — GLUCOSE, CAPILLARY: Glucose-Capillary: 93 mg/dL (ref 70–99)

## 2023-01-17 LAB — TYPE AND SCREEN
ABO/RH(D): A NEG
Antibody Screen: NEGATIVE

## 2023-01-17 LAB — POCT PREGNANCY, URINE: Preg Test, Ur: NEGATIVE

## 2023-01-17 LAB — ABO/RH: ABO/RH(D): A NEG

## 2023-01-17 SURGERY — LAPAROSCOPIC ROUX-EN-Y GASTRIC BYPASS WITH UPPER ENDOSCOPY
Anesthesia: General

## 2023-01-17 MED ORDER — SODIUM CHLORIDE 0.9 % IV SOLN
2.0000 g | INTRAVENOUS | Status: AC
  Administered 2023-01-17: 2 g via INTRAVENOUS
  Filled 2023-01-17: qty 2

## 2023-01-17 MED ORDER — ONDANSETRON HCL 4 MG/2ML IJ SOLN
INTRAMUSCULAR | Status: DC | PRN
  Administered 2023-01-17: 4 mg via INTRAVENOUS

## 2023-01-17 MED ORDER — ACETAMINOPHEN 500 MG PO TABS
1000.0000 mg | ORAL_TABLET | Freq: Three times a day (TID) | ORAL | Status: DC
  Administered 2023-01-17 – 2023-01-19 (×6): 1000 mg via ORAL
  Filled 2023-01-17 (×6): qty 2

## 2023-01-17 MED ORDER — ONDANSETRON HCL 4 MG/2ML IJ SOLN: 4.0000 mg | Freq: Four times a day (QID) | INTRAMUSCULAR | Status: DC | PRN

## 2023-01-17 MED ORDER — ACETAMINOPHEN 160 MG/5ML PO SOLN: 325.0000 mg | ORAL | Status: DC | PRN

## 2023-01-17 MED ORDER — LIDOCAINE HCL (PF) 2 % IJ SOLN
INTRAMUSCULAR | Status: AC
  Filled 2023-01-17: qty 5

## 2023-01-17 MED ORDER — ACETAMINOPHEN 500 MG PO TABS
1000.0000 mg | ORAL_TABLET | ORAL | Status: AC
  Administered 2023-01-17: 1000 mg via ORAL
  Filled 2023-01-17: qty 2

## 2023-01-17 MED ORDER — HEPARIN SODIUM (PORCINE) 5000 UNIT/ML IJ SOLN
5000.0000 [IU] | Freq: Three times a day (TID) | INTRAMUSCULAR | Status: DC
  Administered 2023-01-17 – 2023-01-19 (×5): 5000 [IU] via SUBCUTANEOUS
  Filled 2023-01-17 (×5): qty 1

## 2023-01-17 MED ORDER — FENTANYL CITRATE PF 50 MCG/ML IJ SOSY
PREFILLED_SYRINGE | INTRAMUSCULAR | Status: AC
  Filled 2023-01-17: qty 1

## 2023-01-17 MED ORDER — DEXAMETHASONE SODIUM PHOSPHATE 10 MG/ML IJ SOLN
INTRAMUSCULAR | Status: AC
  Filled 2023-01-17: qty 1

## 2023-01-17 MED ORDER — LACTATED RINGERS IV SOLN: INTRAVENOUS | Status: DC

## 2023-01-17 MED ORDER — FENTANYL CITRATE PF 50 MCG/ML IJ SOSY
PREFILLED_SYRINGE | INTRAMUSCULAR | Status: AC
  Administered 2023-01-17: 50 ug via INTRAVENOUS
  Filled 2023-01-17: qty 1

## 2023-01-17 MED ORDER — OXYCODONE HCL 5 MG PO TABS: 5.0000 mg | ORAL_TABLET | Freq: Once | ORAL | Status: DC | PRN

## 2023-01-17 MED ORDER — ACETAMINOPHEN 160 MG/5ML PO SOLN: 1000.0000 mg | Freq: Three times a day (TID) | ORAL | Status: DC

## 2023-01-17 MED ORDER — FENTANYL CITRATE (PF) 100 MCG/2ML IJ SOLN
INTRAMUSCULAR | Status: DC | PRN
  Administered 2023-01-17 (×3): 50 ug via INTRAVENOUS
  Administered 2023-01-17 (×2): 25 ug via INTRAVENOUS

## 2023-01-17 MED ORDER — CHLORHEXIDINE GLUCONATE 0.12 % MT SOLN
15.0000 mL | Freq: Once | OROMUCOSAL | Status: AC
  Administered 2023-01-17: 15 mL via OROMUCOSAL

## 2023-01-17 MED ORDER — HYDROMORPHONE HCL 1 MG/ML IJ SOLN
INTRAMUSCULAR | Status: AC
  Filled 2023-01-17: qty 1

## 2023-01-17 MED ORDER — ORAL CARE MOUTH RINSE: 15.0000 mL | Freq: Once | OROMUCOSAL | Status: AC

## 2023-01-17 MED ORDER — BUPIVACAINE LIPOSOME 1.3 % IJ SUSP
INTRAMUSCULAR | Status: DC | PRN
  Administered 2023-01-17: 20 mL

## 2023-01-17 MED ORDER — DEXAMETHASONE SODIUM PHOSPHATE 10 MG/ML IJ SOLN
INTRAMUSCULAR | Status: DC | PRN
  Administered 2023-01-17: 8 mg via INTRAVENOUS

## 2023-01-17 MED ORDER — LIDOCAINE 2% (20 MG/ML) 5 ML SYRINGE
INTRAMUSCULAR | Status: DC | PRN
  Administered 2023-01-17: 1.5 mg/kg/h via INTRAVENOUS
  Administered 2023-01-17: 100 mg via INTRAVENOUS

## 2023-01-17 MED ORDER — PROPOFOL 10 MG/ML IV BOLUS
INTRAVENOUS | Status: DC | PRN
  Administered 2023-01-17: 200 mg via INTRAVENOUS

## 2023-01-17 MED ORDER — MEPERIDINE HCL 50 MG/ML IJ SOLN: 6.2500 mg | INTRAMUSCULAR | Status: DC | PRN

## 2023-01-17 MED ORDER — ONDANSETRON HCL 4 MG/2ML IJ SOLN
4.0000 mg | Freq: Once | INTRAMUSCULAR | Status: AC | PRN
  Administered 2023-01-17: 4 mg via INTRAVENOUS

## 2023-01-17 MED ORDER — BUPIVACAINE HCL (PF) 0.25 % IJ SOLN
INTRAMUSCULAR | Status: AC
  Filled 2023-01-17: qty 30

## 2023-01-17 MED ORDER — MIDAZOLAM HCL 5 MG/5ML IJ SOLN
INTRAMUSCULAR | Status: DC | PRN
  Administered 2023-01-17: 2 mg via INTRAVENOUS

## 2023-01-17 MED ORDER — PHENYLEPHRINE HCL-NACL 20-0.9 MG/250ML-% IV SOLN
INTRAVENOUS | Status: DC | PRN
  Administered 2023-01-17: 25 ug/min via INTRAVENOUS

## 2023-01-17 MED ORDER — HYDRALAZINE HCL 20 MG/ML IJ SOLN
10.0000 mg | Freq: Once | INTRAMUSCULAR | Status: AC
  Administered 2023-01-17: 10 mg via INTRAVENOUS

## 2023-01-17 MED ORDER — SIMETHICONE 80 MG PO CHEW
80.0000 mg | CHEWABLE_TABLET | Freq: Four times a day (QID) | ORAL | Status: DC | PRN
  Administered 2023-01-17 – 2023-01-18 (×4): 80 mg via ORAL
  Filled 2023-01-17 (×4): qty 1

## 2023-01-17 MED ORDER — DEXMEDETOMIDINE HCL IN NACL 80 MCG/20ML IV SOLN
INTRAVENOUS | Status: AC
  Filled 2023-01-17: qty 20

## 2023-01-17 MED ORDER — EPHEDRINE 5 MG/ML INJ
INTRAVENOUS | Status: AC
  Filled 2023-01-17: qty 5

## 2023-01-17 MED ORDER — CHLORHEXIDINE GLUCONATE 4 % EX LIQD: Freq: Once | CUTANEOUS | Status: DC

## 2023-01-17 MED ORDER — OXYCODONE HCL 5 MG/5ML PO SOLN
5.0000 mg | Freq: Four times a day (QID) | ORAL | Status: DC | PRN
  Administered 2023-01-17 – 2023-01-19 (×7): 5 mg via ORAL
  Filled 2023-01-17 (×7): qty 5

## 2023-01-17 MED ORDER — SODIUM CHLORIDE 0.9 % IV SOLN: 12.5000 mg | Freq: Three times a day (TID) | INTRAVENOUS | Status: DC | PRN

## 2023-01-17 MED ORDER — PROPOFOL 10 MG/ML IV BOLUS
INTRAVENOUS | Status: AC
  Filled 2023-01-17: qty 20

## 2023-01-17 MED ORDER — ALBUTEROL SULFATE HFA 108 (90 BASE) MCG/ACT IN AERS: 2.0000 | INHALATION_SPRAY | Freq: Four times a day (QID) | RESPIRATORY_TRACT | Status: DC | PRN

## 2023-01-17 MED ORDER — MIDAZOLAM HCL 2 MG/2ML IJ SOLN
INTRAMUSCULAR | Status: AC
  Filled 2023-01-17: qty 2

## 2023-01-17 MED ORDER — ONDANSETRON HCL 4 MG/2ML IJ SOLN
INTRAMUSCULAR | Status: AC
  Filled 2023-01-17: qty 2

## 2023-01-17 MED ORDER — DEXMEDETOMIDINE HCL IN NACL 80 MCG/20ML IV SOLN
INTRAVENOUS | Status: DC | PRN
  Administered 2023-01-17 (×2): 4 ug via BUCCAL

## 2023-01-17 MED ORDER — APREPITANT 40 MG PO CAPS
40.0000 mg | ORAL_CAPSULE | ORAL | Status: AC
  Administered 2023-01-17: 40 mg via ORAL
  Filled 2023-01-17: qty 1

## 2023-01-17 MED ORDER — SCOPOLAMINE 1 MG/3DAYS TD PT72
1.0000 | MEDICATED_PATCH | TRANSDERMAL | Status: DC
  Administered 2023-01-17: 1.5 mg via TRANSDERMAL
  Filled 2023-01-17: qty 1

## 2023-01-17 MED ORDER — 0.9 % SODIUM CHLORIDE (POUR BTL) OPTIME
TOPICAL | Status: DC | PRN
  Administered 2023-01-17: 1000 mL

## 2023-01-17 MED ORDER — LIDOCAINE HCL 2 % IJ SOLN
INTRAMUSCULAR | Status: AC
  Filled 2023-01-17: qty 20

## 2023-01-17 MED ORDER — HEPARIN SODIUM (PORCINE) 5000 UNIT/ML IJ SOLN
5000.0000 [IU] | INTRAMUSCULAR | Status: AC
  Administered 2023-01-17: 5000 [IU] via SUBCUTANEOUS
  Filled 2023-01-17: qty 1

## 2023-01-17 MED ORDER — DEXAMETHASONE SODIUM PHOSPHATE 4 MG/ML IJ SOLN: 4.0000 mg | INTRAMUSCULAR | Status: DC

## 2023-01-17 MED ORDER — OXYCODONE HCL 5 MG/5ML PO SOLN: 5.0000 mg | Freq: Once | ORAL | Status: DC | PRN

## 2023-01-17 MED ORDER — BUPIVACAINE LIPOSOME 1.3 % IJ SUSP
INTRAMUSCULAR | Status: AC
  Filled 2023-01-17: qty 20

## 2023-01-17 MED ORDER — PANTOPRAZOLE SODIUM 40 MG IV SOLR
40.0000 mg | Freq: Every day | INTRAVENOUS | Status: DC
  Administered 2023-01-17 – 2023-01-18 (×2): 40 mg via INTRAVENOUS
  Filled 2023-01-17 (×2): qty 10

## 2023-01-17 MED ORDER — ALBUTEROL SULFATE (2.5 MG/3ML) 0.083% IN NEBU: 2.5000 mg | INHALATION_SOLUTION | Freq: Four times a day (QID) | RESPIRATORY_TRACT | Status: DC | PRN

## 2023-01-17 MED ORDER — HYDRALAZINE HCL 20 MG/ML IJ SOLN: 10.0000 mg | Freq: Once | INTRAMUSCULAR | Status: AC

## 2023-01-17 MED ORDER — LACTATED RINGERS IR SOLN
Status: DC | PRN
  Administered 2023-01-17: 1000 mL

## 2023-01-17 MED ORDER — BUPIVACAINE-EPINEPHRINE 0.25% -1:200000 IJ SOLN
INTRAMUSCULAR | Status: DC | PRN
  Administered 2023-01-17: 30 mL

## 2023-01-17 MED ORDER — ACETAMINOPHEN 325 MG PO TABS: 325.0000 mg | ORAL_TABLET | ORAL | Status: DC | PRN

## 2023-01-17 MED ORDER — KCL IN DEXTROSE-NACL 20-5-0.45 MEQ/L-%-% IV SOLN
INTRAVENOUS | Status: DC
  Filled 2023-01-17 (×7): qty 1000

## 2023-01-17 MED ORDER — ROCURONIUM BROMIDE 10 MG/ML (PF) SYRINGE
PREFILLED_SYRINGE | INTRAVENOUS | Status: AC
  Filled 2023-01-17: qty 10

## 2023-01-17 MED ORDER — HYDRALAZINE HCL 20 MG/ML IJ SOLN
INTRAMUSCULAR | Status: AC
  Administered 2023-01-17: 10 mg via INTRAVENOUS
  Filled 2023-01-17: qty 1

## 2023-01-17 MED ORDER — BUPIVACAINE LIPOSOME 1.3 % IJ SUSP: 20.0000 mL | Freq: Once | INTRAMUSCULAR | Status: DC

## 2023-01-17 MED ORDER — "VISTASEAL 4 ML SINGLE DOSE KIT "
4.0000 mL | PACK | Freq: Once | CUTANEOUS | Status: AC
  Administered 2023-01-17: 4 mL via TOPICAL
  Filled 2023-01-17: qty 4

## 2023-01-17 MED ORDER — FENTANYL CITRATE (PF) 100 MCG/2ML IJ SOLN
INTRAMUSCULAR | Status: AC
  Filled 2023-01-17: qty 2

## 2023-01-17 MED ORDER — EPHEDRINE SULFATE-NACL 50-0.9 MG/10ML-% IV SOSY
PREFILLED_SYRINGE | INTRAVENOUS | Status: DC | PRN
  Administered 2023-01-17 (×2): 5 mg via INTRAVENOUS

## 2023-01-17 MED ORDER — FIBRIN SEALANT 2 ML SINGLE DOSE KIT
2.0000 mL | PACK | Freq: Once | CUTANEOUS | Status: AC
  Administered 2023-01-17: 2 mL via TOPICAL
  Filled 2023-01-17: qty 2

## 2023-01-17 MED ORDER — MORPHINE SULFATE (PF) 2 MG/ML IV SOLN
1.0000 mg | INTRAVENOUS | Status: DC | PRN
  Administered 2023-01-18 (×2): 2 mg via INTRAVENOUS
  Filled 2023-01-17 (×2): qty 1

## 2023-01-17 MED ORDER — HYDROMORPHONE HCL 1 MG/ML IJ SOLN
0.5000 mg | INTRAMUSCULAR | Status: DC | PRN
  Administered 2023-01-17: 0.5 mg via INTRAVENOUS

## 2023-01-17 MED ORDER — ENSURE MAX PROTEIN PO LIQD
2.0000 [oz_av] | ORAL | Status: DC
  Administered 2023-01-18 – 2023-01-19 (×17): 2 [oz_av] via ORAL

## 2023-01-17 MED ORDER — FENTANYL CITRATE PF 50 MCG/ML IJ SOSY
25.0000 ug | PREFILLED_SYRINGE | INTRAMUSCULAR | Status: DC | PRN
  Administered 2023-01-17: 50 ug via INTRAVENOUS

## 2023-01-17 MED ORDER — SUGAMMADEX SODIUM 200 MG/2ML IV SOLN
INTRAVENOUS | Status: DC | PRN
  Administered 2023-01-17: 300 mg via INTRAVENOUS

## 2023-01-17 MED ORDER — ROCURONIUM BROMIDE 10 MG/ML (PF) SYRINGE
PREFILLED_SYRINGE | INTRAVENOUS | Status: DC | PRN
  Administered 2023-01-17: 20 mg via INTRAVENOUS
  Administered 2023-01-17: 100 mg via INTRAVENOUS
  Administered 2023-01-17: 10 mg via INTRAVENOUS

## 2023-01-17 SURGICAL SUPPLY — 83 items
ANTIFOG SOL W/FOAM PAD STRL (MISCELLANEOUS) ×1
APL LAPSCP 35 DL APL RGD (MISCELLANEOUS) ×2
APL PRP STRL LF DISP 70% ISPRP (MISCELLANEOUS) ×2
APL SWBSTK 6 STRL LF DISP (MISCELLANEOUS)
APPLICATOR COTTON TIP 6 STRL (MISCELLANEOUS) IMPLANT
APPLICATOR COTTON TIP 6IN STRL (MISCELLANEOUS)
APPLICATOR VISTASEAL 35 (MISCELLANEOUS) ×2 IMPLANT
APPLIER CLIP ROT 13.4 12 LRG (CLIP)
APR CLP LRG 13.4X12 ROT 20 MLT (CLIP)
BAG COUNTER SPONGE SURGICOUNT (BAG) IMPLANT
BAG SPNG CNTER NS LX DISP (BAG)
BLADE SURG SZ11 CARB STEEL (BLADE) ×1 IMPLANT
CABLE HIGH FREQUENCY MONO STRZ (ELECTRODE) IMPLANT
CHLORAPREP W/TINT 26 (MISCELLANEOUS) ×2 IMPLANT
CLIP APPLIE ROT 13.4 12 LRG (CLIP) IMPLANT
CLIP SUT LAPRA TY ABSORB (SUTURE) ×2 IMPLANT
CUTTER FLEX LINEAR 45M (STAPLE) IMPLANT
DEVICE SUT QUICK LOAD TK 5 (SUTURE) IMPLANT
DEVICE SUT TI-KNOT TK 5X26 (SUTURE) IMPLANT
DEVICE SUTURE ENDOST 10MM (ENDOMECHANICALS) ×1 IMPLANT
DRAIN PENROSE 0.25X18 (DRAIN) ×1 IMPLANT
DRSG TEGADERM 2-3/8X2-3/4 SM (GAUZE/BANDAGES/DRESSINGS) ×6 IMPLANT
ELECT REM PT RETURN 15FT ADLT (MISCELLANEOUS) ×1 IMPLANT
GAUZE 4X4 16PLY ~~LOC~~+RFID DBL (SPONGE) ×1 IMPLANT
GAUZE SPONGE 2X2 8PLY STRL LF (GAUZE/BANDAGES/DRESSINGS) ×1 IMPLANT
GAUZE SPONGE 4X4 12PLY STRL (GAUZE/BANDAGES/DRESSINGS) IMPLANT
GLOVE BIO SURGEON STRL SZ7.5 (GLOVE) ×1 IMPLANT
GLOVE INDICATOR 8.0 STRL GRN (GLOVE) ×1 IMPLANT
GOWN STRL REUS W/ TWL XL LVL3 (GOWN DISPOSABLE) ×4 IMPLANT
GOWN STRL REUS W/TWL XL LVL3 (GOWN DISPOSABLE) ×4
IRRIG SUCT STRYKERFLOW 2 WTIP (MISCELLANEOUS) ×1
IRRIGATION SUCT STRKRFLW 2 WTP (MISCELLANEOUS) ×1 IMPLANT
KIT BASIN OR (CUSTOM PROCEDURE TRAY) ×1 IMPLANT
KIT GASTRIC LAVAGE 34FR ADT (SET/KITS/TRAYS/PACK) ×1 IMPLANT
KIT TURNOVER KIT A (KITS) IMPLANT
MARKER SKIN DUAL TIP RULER LAB (MISCELLANEOUS) ×1 IMPLANT
MAT PREVALON FULL STRYKER (MISCELLANEOUS) ×1 IMPLANT
NDL SPNL 22GX3.5 QUINCKE BK (NEEDLE) ×1 IMPLANT
NEEDLE SPNL 22GX3.5 QUINCKE BK (NEEDLE) ×1 IMPLANT
PACK CARDIOVASCULAR III (CUSTOM PROCEDURE TRAY) ×1 IMPLANT
RELOAD 45 VASCULAR/THIN (ENDOMECHANICALS) IMPLANT
RELOAD ENDO STITCH 2.0 (ENDOMECHANICALS) ×7
RELOAD STAPLE 45 2.5 WHT GRN (ENDOMECHANICALS) IMPLANT
RELOAD STAPLE 45 3.5 BLU ETS (ENDOMECHANICALS) IMPLANT
RELOAD STAPLE 60 2.6 WHT THN (STAPLE) ×2 IMPLANT
RELOAD STAPLE 60 3.6 BLU REG (STAPLE) ×2 IMPLANT
RELOAD STAPLE 60 3.8 GOLD REG (STAPLE) ×1 IMPLANT
RELOAD STAPLE TA45 3.5 REG BLU (ENDOMECHANICALS) IMPLANT
RELOAD STAPLER BLUE 60MM (STAPLE) ×4 IMPLANT
RELOAD STAPLER GOLD 60MM (STAPLE) ×1 IMPLANT
RELOAD STAPLER WHITE 60MM (STAPLE) ×2 IMPLANT
RELOAD SUT SNGL STCH ABSRB 2-0 (ENDOMECHANICALS) ×5 IMPLANT
RELOAD SUT SNGL STCH BLK 2-0 (ENDOMECHANICALS) ×4 IMPLANT
SCISSORS LAP 5X45 EPIX DISP (ENDOMECHANICALS) ×1 IMPLANT
SET TUBE SMOKE EVAC HIGH FLOW (TUBING) ×1 IMPLANT
SHEARS HARMONIC ACE PLUS 45CM (MISCELLANEOUS) ×1 IMPLANT
SLEEVE ADV FIXATION 12X100MM (TROCAR) ×2 IMPLANT
SLEEVE ADV FIXATION 5X100MM (TROCAR) IMPLANT
SOLUTION ANTFG W/FOAM PAD STRL (MISCELLANEOUS) ×1 IMPLANT
STAPLER ECHELON BIOABSB 60 FLE (MISCELLANEOUS) IMPLANT
STAPLER ECHELON LONG 60 440 (INSTRUMENTS) ×1 IMPLANT
STAPLER RELOAD BLUE 60MM (STAPLE) ×4
STAPLER RELOAD GOLD 60MM (STAPLE) ×1
STAPLER RELOAD WHITE 60MM (STAPLE) ×2
STRIP CLOSURE SKIN 1/2X4 (GAUZE/BANDAGES/DRESSINGS) ×1 IMPLANT
SURGILUBE 2OZ TUBE FLIPTOP (MISCELLANEOUS) ×1 IMPLANT
SUT MNCRL AB 4-0 PS2 18 (SUTURE) ×1 IMPLANT
SUT RELOAD ENDO STITCH 2 48X1 (ENDOMECHANICALS) ×5
SUT RELOAD ENDO STITCH 2.0 (ENDOMECHANICALS) ×2
SUT SURGIDAC NAB ES-9 0 48 120 (SUTURE) IMPLANT
SUT VIC AB 2-0 SH 27 (SUTURE) ×1
SUT VIC AB 2-0 SH 27X BRD (SUTURE) ×1 IMPLANT
SUTURE RELOAD END STTCH 2 48X1 (ENDOMECHANICALS) ×5 IMPLANT
SUTURE RELOAD ENDO STITCH 2.0 (ENDOMECHANICALS) ×2 IMPLANT
SYR 20ML LL LF (SYRINGE) ×2 IMPLANT
TOWEL OR 17X26 10 PK STRL BLUE (TOWEL DISPOSABLE) ×1 IMPLANT
TOWEL OR NON WOVEN STRL DISP B (DISPOSABLE) ×1 IMPLANT
TRAY FOLEY MTR SLVR 16FR STAT (SET/KITS/TRAYS/PACK) IMPLANT
TROCAR 5M 150ML BLDLS (TROCAR) IMPLANT
TROCAR ADV FIXATION 12X100MM (TROCAR) ×1 IMPLANT
TROCAR ADV FIXATION 5X100MM (TROCAR) ×1 IMPLANT
TROCAR XCEL NON-BLD 5MMX100MML (ENDOMECHANICALS) ×1 IMPLANT
TUBING CONNECTING 10 (TUBING) ×2 IMPLANT

## 2023-01-17 NOTE — Progress Notes (Signed)
PHARMACY CONSULT FOR:  Risk Assessment for Post-Discharge VTE Following Bariatric Surgery  Post-Discharge VTE Risk Assessment: This patient's probability of 30-day post-discharge VTE is increased due to the factors marked:  Sleeve gastrectomy   Liver disorder (transplant, cirrhosis, or nonalcoholic steatohepatitis)   Hx of VTE   Hemorrhage requiring transfusion   GI perforation, leak, or obstruction   ====================================================    Female    Age >/=60 years  x  BMI >/=50 kg/m2    CHF    Dyspnea at Rest    Paraplegia  x  Non-gastric-band surgery    Operation Time >/=3 hr    Return to OR     Length of Stay >/= 3 d   Hypercoagulable condition   Significant venous stasis   Predicted probability of 30-day post-discharge VTE: 0.27%  Recommendation for Discharge: No pharmacologic DVT prophylaxis indicated   Ruth Evans is a 27 y.o. 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    Nsaids Nausea And Vomiting    Vomiting    Pecan Nut (Diagnostic)     Walnuts, pecans, hazelnuts other nuts    Shrimp (Diagnostic) Hives    Patient Measurements: Height: 5\' 6"  (167.6 cm) Weight: (!) 154.2 kg (340 lb) IBW/kg (Calculated) : 59.3 Body mass index is 54.88 kg/m.  No results for input(s): "WBC", "HGB", "HCT", "PLT", "APTT", "CREATININE", "LABCREA", "CREAT24HRUR", "MG", "PHOS", "ALBUMIN", "PROT", "AST", "ALT", "ALKPHOS", "BILITOT", "BILIDIR", "IBILI" in the last 72 hours. Estimated Creatinine Clearance: 159.4 mL/min (by C-G formula based on SCr of 0.7 mg/dL).    Past Medical History:  Diagnosis Date   Allergy    Anemia    Anxiety    Asthma Since birth   Depression    Endometriosis    GERD (gastroesophageal reflux disease) 01/2021   Headache    Migraine   History of kidney stones    Ovarian cyst     Pneumonia    PONV (postoperative nausea and vomiting)    PTSD (post-traumatic stress disorder)      Medications Prior to Admission  Medication Sig Dispense Refill Last Dose   albuterol (VENTOLIN HFA) 108 (90 Base) MCG/ACT inhaler Inhale 2 puffs into the lungs every 6 (six) hours as needed for wheezing or shortness of breath. 18 g 6 Past Week   aspirin-acetaminophen-caffeine (EXCEDRIN MIGRAINE) 250-250-65 MG tablet Take 2 tablets by mouth every 6 (six) hours as needed for headache.   Past Month   etonogestrel (NEXPLANON) 68 MG IMPL implant 1 each by Subdermal route once.      famotidine (PEPCID) 20 MG tablet Take 1 tablet (20 mg total) by mouth 2 (two) times daily. (Patient taking differently: Take 20 mg by mouth daily as needed for heartburn or indigestion.) 30 tablet 0 Past Month   Vitamin D, Ergocalciferol, (DRISDOL) 1.25 MG (50000 UNIT) CAPS capsule Take 1 capsule (50,000 Units total) by mouth every 7 (seven) days. 12 capsule 0 Past Week   diphenhydrAMINE (BENADRYL) 25 MG tablet Take 1 tablet (25 mg total) by mouth every 6 (six) hours as needed. (Patient taking differently: Take 25 mg by mouth every 6 (six) hours as needed for allergies.) 30 tablet 0 More than a month   EPINEPHrine (EPIPEN 2-PAK) 0.3 mg/0.3 mL IJ SOAJ injection Inject 0.3 mg into the muscle as needed for anaphylaxis. 1  each 0 More than a month    Cindi Carbon, PharmD 01/17/2023,12:37 PM

## 2023-01-17 NOTE — Op Note (Signed)
Shykira Seats [id number] 1996-02-10. 01/17/2023  Preoperative diagnosis:  Severe obesity (BMI 55)  Vitamin D deficiency  PCOS (polycystic ovarian syndrome)  Prediabetes  Chronic pain of left knee  Heartburn  Mild intermittent asthma without complication   Postoperative  diagnosis:  1. same  Surgical procedure: Laparoscopic Roux-en-Y gastric bypass (ante-colic, ante-gastric); upper endoscopy  Surgeon: Atilano Ina, M.D. FACS  Asst.: Phylliss Blakes MD FACS  Anesthesia: General plus exparel/marcaine mix  Complications: None   EBL: Minimal   Drains: None   Disposition: PACU in good condition   Indications for procedure: 27 y.o. yo female with morbid obesity who has been unsuccessful at sustained weight loss. The patient's comorbidities are listed above. We discussed the risk and benefits of surgery including but not limited to anesthesia risk, bleeding, infection, blood clot formation, anastomotic leak, anastomotic stricture, ulcer formation, death, respiratory complications, intestinal blockage, internal hernia, gallstone formation, vitamin and nutritional deficiencies, injury to surrounding structures, failure to lose weight and mood changes.   Description of procedure: Patient is brought to the operating room and general anesthesia induced. The patient had received preoperative broad-spectrum IV antibiotics and subcutaneous heparin. The abdomen was widely sterilely prepped with Chloraprep and draped. Patient timeout was performed and correct patient and procedure confirmed. Access was obtained with a 5 mm Optiview trocar in the left upper quadrant and pneumoperitoneum established without difficulty. Under direct vision 12 mm trocars were placed laterally in the right upper quadrant, right upper quadrant midclavicular line, and to the left and above the umbilicus for the camera port.  The optical entry trocar was changed to a 12 mm trocar.  A 5 mm trocar was placed laterally in  the left upper quadrant.  Exparel/marcaine mix was infiltrated in bilateral lateral abdominal walls as a TAP block for postoperative pain relief.  The omentum was brought into the upper abdomen and the transverse mesocolon elevated and the ligament of Treitz clearly identified. A 50 cm biliopancreatic limb was then carefully measured from the ligament of Treitz. The small intestine was divided at this point with a single firing of the white load linear stapler. A Penrose drain was sutured to the end of the Roux-en-Y limb for later identification. A 100 cm Roux-en-Y limb was then carefully measured. At this point a side-to-side anastomosis was created between the Roux limb and the end of the biliopancreatic limb. This was accomplished with a single firing of the 60 mm white load linear stapler. The common enterotomy was closed with a running 2-0 Vicryl begun at either end of the enterotomy and tied centrally. Vistaseal tissue sealant was placed over the anastomosis. The mesenteric defect was then closed with running 2-0 silk. The omentum was then divided with the harmonic scalpel up towards the transverse colon to allow mobility of the Roux limb toward the gastric pouch. The patient was then placed in steep reversed Trendelenburg. Through a 5 mm subxiphoid site the [facility] retractor was placed and the left lobe of the liver elevated with excellent exposure of the upper stomach and hiatus. The angle of Hiss was then mobilized with the harmonic scalpel. A 5 cm gastric pouch was then carefully measured along the lesser curve of the stomach. Dissection was carried along the lesser curve at this point with the Harmonic scalpel working carefully back toward the lesser sac at right angles to the lesser curve. The free lesser sac was then entered. After being sure all tubes were removed from the stomach an initial firing of the gold  load 60 mm linear stapler was fired at right angles across the lesser curve for about 4 cm.  The gastric pouch was further mobilized posteriorly and then the pouch was completed with 3 further firings of the 60 mm blue load linear stapler up through the previously dissected angle of His. It was ensured that the pouch was completely mobilized away from the gastric remnant. This created a nice tubular 4-5 cm gastric pouch. The Roux limb was then brought up in an antecolic fashion with the candycane facing to the patient's left without undue tension. The gastrojejunostomy was created with an initial posterior row of 2-0 Vicryl between the Roux limb and the staple line of the gastric pouch. Enterotomies were then made in the gastric pouch and the Roux limb with the harmonic scalpel and at approximately 2-2-1/2 cm anastomosis was created with a single firing of the 29mm blue load linear stapler. The staple line was inspected and was intact without bleeding. The common enterotomy was then closed with running 2-0 Vicryl begun at either end and tied centrally. The Ewall tube was then easily passed through the anastomosis and an outer anterior layer of running 2-0 Vicryl was placed. The Ewald tube was removed. With the outlet of the gastrojejunostomy clamped and under saline irrigation the assistant performed upper endoscopy and with the gastric pouch tensely distended with air-there was no evidence of leak on this test. The pouch was desufflated. The Vonita Moss defect was closed with running 2-0 silk. The abdomen was inspected for any evidence of bleeding or bowel injury and everything looked fine. The Nathanson retractor was removed under direct vision after coating the anastomosis with Vistaseal tissue sealant. All CO2 was evacuated and trochars removed. Skin incisions were closed with 4-0 monocryl in a subcuticular fashion followed by benzoin, steri-strips and bandages. Sponge needle and instrument counts were correct. The patient was taken to the PACU in good condition.    Ruth Evans. Andrey Campanile, MD, FACS General,  Bariatric, & Minimally Invasive Surgery [facility], [place]

## 2023-01-17 NOTE — Anesthesia Postprocedure Evaluation (Signed)
Anesthesia Post Note  Patient: Ruth Evans  Procedure(s) Performed: LAPAROSCOPIC ROUX-EN-Y GASTRIC BYPASS WITH UPPER ENDOSCOPY     Patient location during evaluation: PACU Anesthesia Type: General Level of consciousness: awake and alert Pain management: pain level controlled Vital Signs Assessment: post-procedure vital signs reviewed and stable Respiratory status: spontaneous breathing, nonlabored ventilation, respiratory function stable and patient connected to nasal cannula oxygen Cardiovascular status: blood pressure returned to baseline and stable Postop Assessment: no apparent nausea or vomiting Anesthetic complications: no  No notable events documented.  Last Vitals:  Vitals:   01/17/23 1122 01/17/23 1130  BP:  (!) 150/88  Pulse: 64 (!) 59  Resp: 13 12  Temp:    SpO2: 100% 100%    Last Pain:  Vitals:   01/17/23 1130  TempSrc:   PainSc: 6                  Colleene Swarthout

## 2023-01-17 NOTE — Discharge Instructions (Signed)

## 2023-01-17 NOTE — Transfer of Care (Signed)
Immediate Anesthesia Transfer of Care Note  Patient: Ruth Evans  Procedure(s) Performed: LAPAROSCOPIC ROUX-EN-Y GASTRIC BYPASS WITH UPPER ENDOSCOPY  Patient Location: PACU  Anesthesia Type:General  Level of Consciousness: awake, alert , oriented, and patient cooperative  Airway & Oxygen Therapy: Patient Spontanous Breathing and Patient connected to face mask oxygen  Post-op Assessment: Report given to RN, Post -op Vital signs reviewed and stable, and Patient moving all extremities  Post vital signs: Reviewed and stable  Last Vitals:  Vitals Value Taken Time  BP 160/97 01/17/23 1010  Temp    Pulse 68 01/17/23 1011  Resp 18 01/17/23 1011  SpO2 100 % 01/17/23 1011  Vitals shown include unvalidated device data.  Last Pain:  Vitals:   01/17/23 0547  TempSrc: Oral         Complications: No notable events documented.

## 2023-01-17 NOTE — Interval H&P Note (Signed)
History and Physical Interval Note:  01/17/2023 7:25 AM  Ruth Evans  has presented today for surgery, with the diagnosis of MORBID OBESITY.  The various methods of treatment have been discussed with the patient and family. After consideration of risks, benefits and other options for treatment, the patient has consented to  Procedure(s): LAPAROSCOPIC ROUX-EN-Y GASTRIC BYPASS WITH UPPER ENDOSCOPY (N/A) as a surgical intervention.  The patient's history has been reviewed, patient examined, no change in status, stable for surgery.  I have reviewed the patient's chart and labs.  Questions were answered to the patient's satisfaction.    Mary Sella. Andrey Campanile, MD, FACS General, Bariatric, & Minimally Invasive Surgery [facility] Surgery,  [facility]  Gaynelle Adu

## 2023-01-17 NOTE — Progress Notes (Signed)
Mobility Specialist - Progress Note   01/17/23 1451  Mobility  Activity Ambulated independently in hallway;Ambulated independently to bathroom  Level of Assistance Modified independent, requires aide device or extra time  Assistive Device Other (Comment) (IV Pole)  Distance Ambulated (ft) 560 ft  Activity Response Tolerated well  Mobility Referral Yes  $Mobility charge 1 Mobility   Pt received in recliner and agreeable to mobility. Prior to ambulating, pt requested to use bathroom & had a successful void. Amount reported to NT. No complaints during session. Pt to room standing after session with all needs met.    [facility]

## 2023-01-17 NOTE — Anesthesia Procedure Notes (Signed)
Procedure Name: Intubation Date/Time: 01/17/2023 7:39 AM  Performed by: Elisabeth Cara, CRNAPre-anesthesia Checklist: Patient identified, Emergency Drugs available, Suction available, Patient being monitored and Timeout performed Patient Re-evaluated:Patient Re-evaluated prior to induction Oxygen Delivery Method: Circle system utilized Preoxygenation: Pre-oxygenation with 100% oxygen Induction Type: IV induction Ventilation: Mask ventilation without difficulty Laryngoscope Size: Mac and 4 Grade View: Grade I Tube type: Oral Tube size: 7.5 mm Number of attempts: 1 Airway Equipment and Method: Stylet Placement Confirmation: ETT inserted through vocal cords under direct vision, positive ETCO2 and breath sounds checked- equal and bilateral Secured at: 23 cm Tube secured with: Tape Dental Injury: Teeth and Oropharynx as per pre-operative assessment

## 2023-01-17 NOTE — H&P (Signed)
PROVIDER: Arek Spadafore Sherril Cong, MD  MRN: [id number] DOB: 1996-04-14 DATE OF ENCOUNTER: 12/31/2022 Subjective  Chief Complaint: Pre-op Exam (Preop for surgery date: 01/17/23)   History of Present Illness: Ruth Evans is a 27 y.o. female who is seen today for long-term follow-up regarding her severe obesity and related comorbidities. I initially met her in February 2024 to discuss bariatric surgery.  Her comorbidities include prediabetes, PCOS, left knee pain, vitamin D deficiency, occasional GERD  She has completed the bariatric surgery evaluation process.  She has received nutritional and psychological evaluation and clearance. She denies any medical changes since I saw her in February. She denies any chest pain, chest pressure, shortness of breath, abdominal pain. No lightheadedness or dizziness. She is taking prescription vitamin D for her vitamin D deficiency.  Review of Systems: A complete review of systems was obtained from the patient. I have reviewed this information and discussed as appropriate with the patient. See HPI as well for other ROS.  ROS  Medical History: Past Medical History: Diagnosis Date Anxiety Asthma, unspecified asthma severity, unspecified whether complicated, unspecified whether persistent  Patient Active Problem List Diagnosis Mild intermittent asthma without complication  Past Surgical History: Procedure Laterality Date foot surgery 2009   Allergies Allergen Reactions Codeine Nausea And Vomiting Able to take Dilaudid. Able to take Dilaudid.  Able to take Dilaudid.  Able to take Dilaudid. Able to take Dilaudid. Able to take Dilaudid. Able to take Dilaudid. Nsaids (Non-Steroidal Anti-Inflammatory Drug) Nausea And Vomiting Vomiting Vomiting  Vomiting  Vomiting Vomiting Vomiting Tolmetin Nausea And  Vomiting Vomiting Vomiting Vomiting  Vomiting Vomiting Vomiting Vomiting Vomiting Vomiting  Vomiting Vomiting Vomiting  Current Outpatient Medications on File Prior to Visit Medication Sig Dispense Refill albuterol 90 mcg/actuation inhaler Inhale into the lungs cetirizine (ZYRTEC) 10 MG tablet Take 10 mg by mouth once daily EPINEPHrine (EPIPEN) 0.3 mg/0.3 mL auto-injector Inject 0.3 mg into the muscle once as needed for Anaphylaxis ergocalciferol, vitamin D2, 1,250 mcg (50,000 unit) capsule Take 50,000 Units by mouth once a week etonogestrel (NEXPLANON) 68 mg (LARC) implant Inject subcutaneously  No current facility-administered medications on file prior to visit.  Family History Problem Relation Age of Onset Skin cancer Mother Obesity Mother High blood pressure (Hypertension) Mother Obesity Father   Social History  Tobacco Use Smoking Status Never Smokeless Tobacco Never   Social History  Socioeconomic History Marital status: Married Tobacco Use Smoking status: Never Smokeless tobacco: Never Vaping Use Vaping Use: Never used Substance and Sexual Activity Alcohol use: Yes Drug use: Never  Objective:  Vitals: 12/31/22 0819 BP: 130/80 Pulse: 86 Temp: 36.6 C (97.8 F) SpO2: (!) 75% Weight: (!) 162.8 kg (358 lb 12.8 oz) PainSc: 0-No pain  Body mass index is 57.91 kg/m.  Gen: alert, NAD, non-toxic appearing, severe obesity Pupils: equal, no scleral icterus Pulm: Lungs clear to auscultation, symmetric chest rise CV: regular rate and rhythm Abd: soft, nontender, nondistended. No cellulitis. No incisional hernia Ext: no edema, Skin: no rash, no jaundice  Labs, Imaging and Diagnostic Testing:  labs on October 20, 2022. She had a CBC, comprehensive metabolic panel, lipid panel, anemia panel, hemoglobin A1c, vitamin D level, TSH, FSH, LH, testosterone-vitamin D level was 12, she has a mild chronic leukocytosis. Most recent 1 was 10.7. Hemoglobin A1c  was 5.8.  Upper GI November 29, 2022-within normal limits  Last x-ray November 29, 2022 within normal limits  Family medicine office note November 29, 2022  Assessment and Plan: Diagnoses and all orders for this visit:  Severe obesity (CMS-HCC)  Vitamin D deficiency  PCOS (polycystic ovarian syndrome)  Prediabetes  Chronic pain of left knee  Heartburn  Mild intermittent asthma without complication    We reviewed her workup including her imaging and labs. She has attended her preoperative education class. We rediscussed the typical hospitalization. We rediscussed the typical postoperative course. We discussed the diet transition after surgery. I offered to rediscussed steps of the surgery along with risk and benefits but she declined. She read over the surgical consent form and I answered any remaining questions she had. We discussed the importance of the preoperative meal plan and discussed staying active between now and surgery.  This patient encounter took 20 minutes today to perform the following: take history, perform exam, review outside records, interpret imaging, counsel the patient on their diagnosis and document encounter, findings & plan in the EHR  No follow-ups on file.  Mary Sella. Andrey Campanile MD FACS General, Minimally Invasive, & Bariatric Surgery Electronically signed by Gara Kroner, MD at 12/31/2022 8:34 AM EDT

## 2023-01-17 NOTE — Op Note (Signed)
Preoperative diagnosis: Roux-en-Y gastric bypass  Postoperative diagnosis: Same   Procedure: Upper endoscopy   Surgeon: Berna Bue, M.D.  Anesthesia: Gen.   Description of procedure: The endoscope was placed in the mouth and oropharynx and under endoscopic vision it was advanced to the esophagogastric junction which was identified at 37cm from the teeth.  The pouch was tensely insufflated while the upper abdomen was flooded with irrigation to perform a leak test, which was negative. No bubbles were seen.  The staple line was hemostatic and the anastomosis is visibly patent. The pouch measures 5cm in length and there is no retained fundus. The lumen was decompressed and the scope was withdrawn without difficulty.    Berna Bue, M.D. General, Bariatric, & Minimally Invasive Surgery [facility]

## 2023-01-17 NOTE — Progress Notes (Signed)
Discussed QI "Goals for Discharge" document with patient including ambulation in halls, Incentive Spirometry use every hour, and oral care.  Also discussed pain and nausea control.  Enabled or verified head of bed 30 degree alarm activated.  BSTOP education provided including BSTOP information guide, "Guide for Pain Management after your Bariatric Procedure".  Diet progression education provided including "Bariatric Surgery Post-Op Food Plan Phase 1: Liquids".  Questions answered.  Will continue to partner with bedside RN and follow up with patient per protocol.    Thank you,  Colt Martelle Miata Culbreth, RN, MSN Bariatric Nurse Coordinator [phone] (office)  

## 2023-01-18 ENCOUNTER — Encounter (HOSPITAL_COMMUNITY): Payer: Self-pay | Admitting: General Surgery

## 2023-01-18 LAB — COMPREHENSIVE METABOLIC PANEL
ALT: 23 U/L (ref 0–44)
AST: 24 U/L (ref 15–41)
Albumin: 3.9 g/dL (ref 3.5–5.0)
Alkaline Phosphatase: 38 U/L (ref 38–126)
Anion gap: 9 (ref 5–15)
BUN: 10 mg/dL (ref 6–20)
CO2: 26 mmol/L (ref 22–32)
Calcium: 9 mg/dL (ref 8.9–10.3)
Chloride: 105 mmol/L (ref 98–111)
Creatinine, Ser: 0.75 mg/dL (ref 0.44–1.00)
GFR, Estimated: >60 mL/min (ref >60)
Glucose, Bld: 141 mg/dL — ABNORMAL HIGH (ref 70–99)
Potassium: 4.6 mmol/L (ref 3.5–5.1)
Sodium: 140 mmol/L (ref 135–145)
Total Bilirubin: 0.5 mg/dL (ref 0.3–1.2)
Total Protein: 7.1 g/dL (ref 6.5–8.1)

## 2023-01-18 LAB — CBC WITH DIFFERENTIAL/PLATELET
Abs Immature Granulocytes: 0.05 10*3/uL (ref 0.00–0.07)
Basophils Absolute: 0 10*3/uL (ref 0.0–0.1)
Basophils Relative: 0 %
Eosinophils Absolute: 0 10*3/uL (ref 0.0–0.5)
Eosinophils Relative: 0 %
HCT: 37.2 % (ref 36.0–46.0)
Hemoglobin: 12.2 g/dL (ref 12.0–15.0)
Immature Granulocytes: 0 %
Lymphocytes Relative: 13 %
Lymphs Abs: 1.8 10*3/uL (ref 0.7–4.0)
MCH: 30 pg (ref 26.0–34.0)
MCHC: 32.8 g/dL (ref 30.0–36.0)
MCV: 91.6 fL (ref 80.0–100.0)
Monocytes Absolute: 1.3 10*3/uL — ABNORMAL HIGH (ref 0.1–1.0)
Monocytes Relative: 10 %
Neutro Abs: 10.2 10*3/uL — ABNORMAL HIGH (ref 1.7–7.7)
Neutrophils Relative %: 77 %
Platelets: 256 10*3/uL (ref 150–400)
RBC: 4.06 MIL/uL (ref 3.87–5.11)
RDW: 12.2 % (ref 11.5–15.5)
WBC: 13.4 10*3/uL — ABNORMAL HIGH (ref 4.0–10.5)
nRBC: 0 % (ref 0.0–0.2)

## 2023-01-18 MED ORDER — SUCRALFATE 1 GM/10ML PO SUSP
1.0000 g | Freq: Three times a day (TID) | ORAL | Status: DC
  Administered 2023-01-18 – 2023-01-19 (×5): 1 g via ORAL
  Filled 2023-01-18 (×5): qty 10

## 2023-01-18 NOTE — TOC Progression Note (Signed)
Transition of Care [facility]) - Progression Note    Patient Details  Name: Ruth Evans MRN: [id number] Date of Birth: [birth year]  Transition [facility]) CM/SW Contact  Geni Bers, RN Phone Number: 01/18/2023, 8:55 AM  Clinical Narrative:      Transition of Care (TOC) Screening Note   Patient Details  Name: Ruth Evans Date of Birth: Sep 08, 1996   Transition of Care [facility]) CM/SW Contact:    Geni Bers, RN Phone Number: 01/18/2023, 8:55 AM    Transition of Care Department (TOC) has reviewed patient and no TOC needs have been identified at this time. We will continue to monitor patient advancement through interdisciplinary progression rounds. If new patient transition needs arise, please place a TOC consult.         Expected Discharge Plan and Services                                               Social Determinants of Health (SDOH) Interventions SDOH Screenings   Food Insecurity: No Food Insecurity (01/17/2023)  Housing: Low Risk  (01/17/2023)  Transportation Needs: No Transportation Needs (01/17/2023)  Utilities: Not At Risk (01/17/2023)  Depression (PHQ2-9): Low Risk  (11/29/2022)  Recent Concern: Depression (PHQ2-9) - Medium Risk (10/21/2022)  Tobacco Use: Low Risk  (01/18/2023)    Readmission Risk Interventions     No data to display

## 2023-01-18 NOTE — Progress Notes (Signed)
Patient alert and oriented, Post op day 1.  Provided support and encouragement.  Encouraged pulmonary toilet, ambulation and small sips of liquids.  All questions answered.  Will continue to monitor. 

## 2023-01-18 NOTE — Progress Notes (Signed)
Patient alert and oriented, pain is controlled. Patient is tolerating fluids, advanced to protein shake today, patient is tolerating well. Reviewed Gastric sleeve discharge instructions with patient and patient is able to articulate understanding. Provided information on BELT program, Support Group and [facility] outpatient pharmacy. Communicated general update of patient status to surgeon. All questions answered. 24hr fluid recall is 720mL per hydration protocol, bariatric nurse coordinator to make follow-up phone call within one week.  

## 2023-01-19 ENCOUNTER — Other Ambulatory Visit (HOSPITAL_COMMUNITY): Payer: Self-pay

## 2023-01-19 LAB — CBC WITH DIFFERENTIAL/PLATELET
Abs Immature Granulocytes: 0.03 10*3/uL (ref 0.00–0.07)
Basophils Absolute: 0 10*3/uL (ref 0.0–0.1)
Basophils Relative: 0 %
Eosinophils Absolute: 0.1 10*3/uL (ref 0.0–0.5)
Eosinophils Relative: 1 %
HCT: 36.3 % (ref 36.0–46.0)
Hemoglobin: 11.5 g/dL — ABNORMAL LOW (ref 12.0–15.0)
Immature Granulocytes: 0 %
Lymphocytes Relative: 26 %
Lymphs Abs: 3 10*3/uL (ref 0.7–4.0)
MCH: 29.9 pg (ref 26.0–34.0)
MCHC: 31.7 g/dL (ref 30.0–36.0)
MCV: 94.3 fL (ref 80.0–100.0)
Monocytes Absolute: 1.1 10*3/uL — ABNORMAL HIGH (ref 0.1–1.0)
Monocytes Relative: 9 %
Neutro Abs: 7.2 10*3/uL (ref 1.7–7.7)
Neutrophils Relative %: 64 %
Platelets: 232 10*3/uL (ref 150–400)
RBC: 3.85 MIL/uL — ABNORMAL LOW (ref 3.87–5.11)
RDW: 12.8 % (ref 11.5–15.5)
WBC: 11.4 10*3/uL — ABNORMAL HIGH (ref 4.0–10.5)
nRBC: 0 % (ref 0.0–0.2)

## 2023-01-19 MED ORDER — OXYCODONE HCL 5 MG PO TABS
5.0000 mg | ORAL_TABLET | Freq: Four times a day (QID) | ORAL | 0 refills | Status: DC | PRN
Start: 2023-01-19 — End: 2023-05-30
  Filled 2023-01-19: qty 10, 3d supply, fill #0

## 2023-01-19 MED ORDER — ACETAMINOPHEN 500 MG PO TABS
1000.0000 mg | ORAL_TABLET | Freq: Three times a day (TID) | ORAL | 0 refills | Status: AC
Start: 2023-01-19 — End: 2023-01-24

## 2023-01-19 MED ORDER — ONDANSETRON 4 MG PO TBDP
4.0000 mg | ORAL_TABLET | Freq: Four times a day (QID) | ORAL | 0 refills | Status: AC | PRN
Start: 2023-01-19 — End: ?
  Filled 2023-01-19: qty 20, 5d supply, fill #0

## 2023-01-19 MED ORDER — FAMOTIDINE 20 MG PO TABS: 20.0000 mg | ORAL_TABLET | Freq: Every day | ORAL | 0 refills | Status: AC

## 2023-01-19 NOTE — Progress Notes (Signed)
Patient was given discharge instructions, and all questions were answered.  Patient was stable for discharge and was taken to the main exit by wheelchair. 

## 2023-01-19 NOTE — Progress Notes (Signed)
Patient alert and oriented, Post op day 2.  Provided support and encouragement.  Encouraged pulmonary toilet, ambulation and small sips of liquids. Pt tolerating fluid intake better and pain control has improved. All questions answered.  Will continue to monitor.    Thank you,  Lubertha Basque, RN, MSN Bariatric Nurse Coordinator [phone] (office)

## 2023-01-19 NOTE — Progress Notes (Signed)
Delayed note entry.     Progress Note: Metabolic and Bariatric Surgery Service   Chief Complaint/Subjective: Pain with drinking Ambulating   Objective: Vital signs in last 24 hours: Temp:  [97.3 F (36.3 C)-98.8 F (37.1 C)] 97.7 F (36.5 C) (04/10 0621) Pulse Rate:  [52-62] 55 (04/10 0621) Resp:  [16-18] 18 (04/10 0621) BP: (136-190)/(80-90) 153/88 (04/10 0621) SpO2:  [98 %-100 %] 99 % (04/10 0621) Last BM Date : 01/16/23  Intake/Output from previous day: 04/09 0701 - 04/10 0700 In: 4286.4 [P.O.:780; I.V.:3506.4] Out: 2900 [Urine:2900] Intake/Output this shift: No intake/output data recorded.  Lungs: symmetric nonlabored  Cardiovascular: reg  Abd: soft, approp TTP  Extremities: no edema  Neuro: alert/ox3  Lab Results: CBC  Recent Labs    01/18/23 0502 01/19/23 0440  WBC 13.4* 11.4*  HGB 12.2 11.5*  HCT 37.2 36.3  PLT 256 232   BMET Recent Labs    01/17/23 1253 01/18/23 0502  NA  --  140  K  --  4.6  CL  --  105  CO2  --  26  GLUCOSE  --  141*  BUN  --  10  CREATININE 0.85 0.75  CALCIUM  --  9.0   PT/INR No results for input(s): "LABPROT", "INR" in the last 72 hours. ABG No results for input(s): "PHART", "HCO3" in the last 72 hours.  Invalid input(s): "PCO2", "PO2"  Studies/Results:  Anti-infectives: Anti-infectives (From admission, onward)    Start     Dose/Rate Route Frequency Ordered Stop   01/17/23 0600  cefoTEtan (CEFOTAN) 2 g in sodium chloride 0.9 % 100 mL IVPB        2 g 200 mL/hr over 30 Minutes Intravenous On call to O.R. 01/17/23 0536 01/17/23 0811       Medications: Scheduled Meds:  acetaminophen  1,000 mg Oral Q8H   Or   acetaminophen (TYLENOL) oral liquid 160 mg/5 mL  1,000 mg Oral Q8H   heparin injection (subcutaneous)  5,000 Units Subcutaneous Q8H   pantoprazole (PROTONIX) IV  40 mg Intravenous QHS   Ensure Max Protein  2 oz Oral Q2H   sucralfate  1 g Oral TID WC & HS   Continuous Infusions:  dextrose 5 %  and 0.45 % NaCl with KCl 20 mEq/L 125 mL/hr at 01/19/23 0743   promethazine (PHENERGAN) injection (IM or IVPB)     PRN Meds:.albuterol, morphine injection, ondansetron (ZOFRAN) IV, oxyCODONE, promethazine (PHENERGAN) injection (IM or IVPB), simethicone  Assessment/Plan: Patient Active Problem List   Diagnosis Date Noted   S/P gastric bypass 01/17/2023   PCOS (polycystic ovarian syndrome) 11/29/2022   Prediabetes 11/29/2022   Vitamin D deficiency 10/21/2022   Morbid obesity 10/21/2022   Anxiety and depression 10/21/2022   Nexplanon in place 10/01/2016   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

## 2023-01-19 NOTE — Discharge Summary (Signed)
Physician Discharge Summary  Ruth Evans FBP:[id number] DOB: 1996-01-16 DOA: 01/17/2023  PCP: Gerre Scull, NP  Admit date: 01/17/2023 Discharge date: 01/19/23  Recommendations for Outpatient Follow-up:     Follow-up Information     Gaynelle Adu, MD Follow up.   Specialty: General Surgery Contact information: [address] [place] [address] [place] [address] [phone]                Discharge Diagnoses:  Principal Problem:   S/P gastric bypass Severe obesity (BMI 55)  Vitamin D deficiency  PCOS (polycystic ovarian syndrome)  Prediabetes  Chronic pain of left knee  Heartburn  Mild intermittent asthma without complication   Surgical Procedure: Laparoscopic Roux-en-Y gastric bypass, upper endoscopy  Discharge Condition: Good Disposition: Home  Diet recommendation: Postoperative gastric bypass diet  Filed Weights   01/17/23 0547 01/17/23 0637  Weight: (!) 154.2 kg (!) 154.2 kg     Hospital Course:  The patient was admitted for a planned laparoscopic Roux-en-Y gastric bypass. Please see operative note. Preoperatively the patient was given 5000 units of subcutaneous heparin for DVT prophylaxis. ERAS protocol was used. Postoperative prophylactic heparin dosing was started on the evening of postoperative day 0.  The patient was started on ice chips and water on the evening of POD 0 which they tolerated. On postoperative day 1 The patient's diet was advanced to protein shakes which they also tolerated however the patient was having too much pain for safe discharge and requiring IV pain meds. . On POD 2, The patient was ambulating without difficulty. Their vital signs are stable without fever or tachycardia. Their hemoglobin had remained stable. The patient had received discharge instructions and counseling. They were deemed stable for discharge.  BP (!) 158/83 (BP Location: Right Arm)   Pulse 62   Temp 97.7 F (36.5 C)   Resp (!) 25   Ht 5\' 6"  (1.676 m)    Wt (!) 154.2 kg   LMP 01/07/2023 (Exact Date) Comment: POCT urine pregnancy negative 01/17/2023  SpO2 100%   BMI 54.88 kg/m   Gen: alert, NAD, non-toxic appearing Pupils: equal, no scleral icterus Pulm: Lungs clear to auscultation, symmetric chest rise CV: regular rate and rhythm Abd: soft, min tender, nondistended. No cellulitis. No incisional hernia Ext: no edema, no calf tenderness Skin: no rash, no jaundice  Discharge Instructions  Discharge Instructions     Ambulate hourly while awake   Complete by: As directed    Call MD for:  difficulty breathing, headache or visual disturbances   Complete by: As directed    Call MD for:  persistant dizziness or light-headedness   Complete by: As directed    Call MD for:  persistant nausea and vomiting   Complete by: As directed    Call MD for:  redness, tenderness, or signs of infection (pain, swelling, redness, odor or green/yellow discharge around incision site)   Complete by: As directed    Call MD for:  severe uncontrolled pain   Complete by: As directed    Call MD for:  temperature >101 F   Complete by: As directed    Diet bariatric full liquid   Complete by: As directed    Discharge instructions   Complete by: As directed    See bariatric discharge instructions   Incentive spirometry   Complete by: As directed    Perform hourly while awake      Allergies as of 01/19/2023       Reactions  Tolmetin Nausea And Vomiting   Codeine Nausea And Vomiting   Able to take Dilaudid.    Nsaids Nausea And Vomiting   Vomiting    Pecan Nut (diagnostic)    Walnuts, pecans, hazelnuts other nuts    Shrimp (diagnostic) Hives        Medication List     STOP taking these medications    aspirin-acetaminophen-caffeine 250-250-65 MG tablet Commonly known as: EXCEDRIN MIGRAINE   Nexplanon 68 MG Impl implant Generic drug: etonogestrel       TAKE these medications    acetaminophen 500 MG tablet Commonly known as:  TYLENOL Take 2 tablets (1,000 mg total) by mouth every 8 (eight) hours for 5 days.   albuterol 108 (90 Base) MCG/ACT inhaler Commonly known as: VENTOLIN HFA Inhale 2 puffs into the lungs every 6 (six) hours as needed for wheezing or shortness of breath.   diphenhydrAMINE 25 MG tablet Commonly known as: BENADRYL Take 1 tablet (25 mg total) by mouth every 6 (six) hours as needed. What changed: reasons to take this   EPINEPHrine 0.3 mg/0.3 mL Soaj injection Commonly known as: EpiPen 2-Pak Inject 0.3 mg into the muscle as needed for anaphylaxis.   famotidine 20 MG tablet Commonly known as: PEPCID Take 1 tablet (20 mg total) by mouth daily. What changed: when to take this   ondansetron 4 MG disintegrating tablet Commonly known as: ZOFRAN-ODT Take 1 tablet (4 mg total) by mouth every 6 (six) hours as needed for nausea or vomiting.   oxyCODONE 5 MG immediate release tablet Commonly known as: Oxy IR/ROXICODONE Take 1 tablet (5 mg total) by mouth every 6 (six) hours as needed for severe pain.   Vitamin D (Ergocalciferol) 1.25 MG (50000 UNIT) Caps capsule Commonly known as: DRISDOL Take 1 capsule (50,000 Units total) by mouth every 7 (seven) days.        Follow-up Information     Gaynelle Adu, MD Follow up.   Specialty: General Surgery Contact information: [address] [place] [address] [phone]                  The results of significant diagnostics from this hospitalization (including imaging, microbiology, ancillary and laboratory) are listed below for reference.    Significant Diagnostic Studies: No results found.  Labs: Basic Metabolic Panel: Recent Labs  Lab 01/17/23 1253 01/18/23 0502  NA  --  140  K  --  4.6  CL  --  105  CO2  --  26  GLUCOSE  --  141*  BUN  --  10  CREATININE 0.85 0.75  CALCIUM  --  9.0   Liver Function Tests: Recent Labs  Lab 01/18/23 0502  AST 24  ALT 23  ALKPHOS 38  BILITOT 0.5  PROT 7.1   ALBUMIN 3.9    CBC: Recent Labs  Lab 01/17/23 1253 01/18/23 0502 01/19/23 0440  WBC 15.7* 13.4* 11.4*  NEUTROABS  --  10.2* 7.2  HGB 13.9 12.2 11.5*  HCT 42.3 37.2 36.3  MCV 91.4 91.6 94.3  PLT 295 256 232    CBG: Recent Labs  Lab 01/17/23 0600  GLUCAP 93    Principal Problem:   S/P gastric bypass   Time coordinating discharge: 15 min  Signed:  Atilano Ina, MD [facility] [phone] 01/19/2023, 11:56 AM

## 2023-01-20 ENCOUNTER — Telehealth: Payer: Self-pay

## 2023-01-20 NOTE — Transitions of Care (Post Inpatient/ED Visit) (Signed)
   01/20/2023  Name: Ruth Evans MRN: [id number] DOB: 1996-03-24  Today's TOC FU Call Status: Today's TOC FU Call Status:: Unsuccessul Call (1st Attempt) Unsuccessful Call (1st Attempt) Date: 01/20/23  Attempted to reach the patient regarding the most recent Inpatient/ED visit.  Follow Up Plan: Additional outreach attempts will be made to reach the patient to complete the Transitions of Care (Post Inpatient/ED visit) call.   Agnes Lawrence, CMA (AAMA)  [facility] [phone]

## 2023-01-26 NOTE — Transitions of Care (Post Inpatient/ED Visit) (Signed)
   01/26/2023  Name: Ruth Evans MRN: [id number] DOB: 06-Apr-1996  Today's TOC FU Call Status: Today's TOC FU Call Status:: Unsuccessful Call (2nd Attempt) Unsuccessful Call (1st Attempt) Date: 01/20/23 Unsuccessful Call (2nd Attempt) Date: 01/26/23  Attempted to reach the patient regarding the most recent Inpatient/ED visit.  Follow Up Plan: Additional outreach attempts will be made to reach the patient to complete the Transitions of Care (Post Inpatient/ED visit) call.   Signature Agnes Lawrence, CMA (AAMA)  [facility] [phone]

## 2023-01-28 ENCOUNTER — Telehealth (HOSPITAL_COMMUNITY): Payer: Self-pay | Admitting: *Deleted

## 2023-01-28 NOTE — Telephone Encounter (Signed)
Pt returned call 1. Tell me about your pain and pain management?     Pt denies any pain at the moment. Pt c/o mid-abdominal incisional pain with movement and exertion and noted she had a burning sensation towards the side of her back, she thinks she may have jolted a sudden movement and it affected a nerve. Pt stated that she is feeling better now Encouraged pt to contact [facility] if still concerned.      2. Let's talk about fluid intake. How much total fluid are you taking in?   Pt states that s/he is getting in at least 80oz of fluid including protein shakes, bottled water    3. How much protein have you taken in the last day?     Pt states she is meeting the goal of 60g of protein each day with the protein shakes. She discovered she had much relief of gas with the [employer] brand.    4. Have you had nausea? Tell me about when you have experienced nausea and what you did to help?   Pt denies nausea.    5. Tell me what your incisions look like?   "Incisions look fine". Pt denies a fever, chills. Pt states incisions are not swollen, open, or draining.    7. Have you been passing gas? BM?   Pt states that they are having BMs about every 2-3 days. First bm after surgery was Sunday  Pt instructed to take either Miralax or MoM as instructed per "Gastric Bypass/Sleeve Discharge Home Care Instructions". Pt to call surgeon's office if not able to have BM with medication.      8. If a problem or question were to arise who would you call? Do you know contact numbers for [facility], [facility], and [facility]?   Pt knows to call [facility] for surgical, [facility] for nutrition, and [facility] for non-urgent questions or concerns. Pt denies dehydration symptoms. Pt can describe s/sx of dehydration.   9. How has the walking going?   Pt states s/he is walking around and able to be active without difficulty.   10. Are you still using your incentive spirometer? If so, how often?   Pt states that s/he is doing the I.S. Pt  encouraged to use incentive spirometer, at least 10x every hour while awake until s/he sees the surgeon.   11. How are your vitamins and calcium going? How are you taking them?    Pt states that s/he is taking his/her supplements and vitamins without difficulty.

## 2023-01-28 NOTE — Telephone Encounter (Signed)
Voicemail left.

## 2023-02-01 ENCOUNTER — Encounter: Payer: Self-pay | Admitting: Dietician

## 2023-02-01 ENCOUNTER — Encounter: Payer: BC Managed Care – PPO | Attending: General Surgery | Admitting: Dietician

## 2023-02-01 VITALS — Ht 66.0 in | Wt 329.5 lb

## 2023-02-01 DIAGNOSIS — E669 Obesity, unspecified: Secondary | ICD-10-CM | POA: Diagnosis not present

## 2023-02-01 NOTE — Progress Notes (Signed)
2 Week Post-Operative Nutrition Class   Patient was seen on 02/01/2023 for Post-Operative Nutrition education at the [facility].    Surgery date: 01/17/2023 Surgery type: RYGB  Anthropometrics  Start weight at [facility]: 348.3 lbs (date: 11/23/2022)  Height: 66 in Weight today: 329.5 lbs BMI: 53.18 kg/m2     Clinical  Medical hx: obesity, asthma Medications: multivitamin, vitamin D, albutrol , nexplanon, epinephrine, biotin/collogin   Labs: vit D 12.08,  Notable signs/symptoms: none noted Any previous deficiencies? No Bowel Habits: Every day to every other day no complaints   Body Composition Scale 02/01/2023  Current Body Weight 329.5  Total Body Fat % 49.2  Visceral Fat 16  Fat-Free Mass % 50.7   Total Body Water % 39.8  Muscle-Mass lbs 35.5  BMI 53.2  Body Fat Displacement          Torso  lbs 100.8         Left Leg  lbs 20.1         Right Leg  lbs 20.1         Left Arm  lbs 10.0         Right Arm  lbs 10.0     The following the learning objectives were met by the patient during this course: Identifies Soft Prepped Plan Advancement Guide  Identifies Soft, High Proteins (Phase 1), beginning 2 weeks post-operatively to 3 weeks post-operatively Identifies Additional Soft High Proteins, soft non-starchy vegetables, fruits and starches (Phase 2), beginning 3 weeks post-operatively to 3 months post-operatively Identifies appropriate sources of fluids, proteins, vegetables, fruits and starches Identifies appropriate fat sources and healthy verses unhealthy fat types   States protein, vegetable, fruit and starch recommendations and appropriate sources post-operatively Identifies the need for appropriate texture modifications, mastication, and bite sizes when consuming solids Identifies appropriate fat consumption and sources Identifies appropriate multivitamin and calcium sources post-operatively Describes the need for physical activity  post-operatively and will follow MD recommendations States when to call healthcare provider regarding medication questions or post-operative complications   Handouts given during class include: Soft Prepped Plan Advancement Guide   Follow-Up Plan: Patient will follow-up at [facility] in 10 weeks for 3 month post-op nutrition visit for diet advancement per MD.

## 2023-02-10 ENCOUNTER — Telehealth: Payer: Self-pay | Admitting: Dietician

## 2023-02-10 NOTE — Telephone Encounter (Signed)
RD called pt to verify fluid intake once starting soft, solid proteins 2 week post-bariatric surgery.   Daily Fluid intake: Daily Protein intake: Bowel Habits:   Concerns/issues:    Left Voice Message 

## 2023-03-08 ENCOUNTER — Telehealth: Payer: Self-pay | Admitting: Skilled Nursing Facility1

## 2023-03-08 NOTE — Telephone Encounter (Signed)
Returned pts call.  Pt states for the last week she has been struggling to keep foods down even ones that previously were fine. Pt state she has been throwing up solids but liquids have been fine. Pt states foods that do not make her throw up are yogurt.  Taking 20 minutes to eat not drinking with meals and making her foods soft. Pt states she eats about every 2 hours. Pt states even mashed potatoes came back up.   Pt states she throws it up after eating on it for a bit. Pt states she has nausea and has ben a lot more burpy in general.   Will try acid reducer she was prescribed and cut back to 2 ounces at once; lean more on soft proteins form week one; take the multivitamin later in the day with food on your stomach; call surgeon if by Thursday none of this helps.   Pt states she has a bowel movement every other day.

## 2023-04-25 ENCOUNTER — Encounter: Payer: BC Managed Care – PPO | Attending: General Surgery | Admitting: Dietician

## 2023-04-25 ENCOUNTER — Encounter: Payer: Self-pay | Admitting: Dietician

## 2023-04-25 DIAGNOSIS — E669 Obesity, unspecified: Secondary | ICD-10-CM | POA: Diagnosis not present

## 2023-04-25 NOTE — Progress Notes (Signed)
Bariatric Nutrition Follow-Up Visit Medical Nutrition Therapy  Appt Start Time: 7:52   End Time: 8:25  Surgery date: 01/17/2023 Surgery type: RYGB  NUTRITION ASSESSMENT  Anthropometrics  Start weight at [facility]: 348.3 lbs (date: 11/23/2022)  Height: 66 in Weight today: 292.2 lbs   Clinical  Medical hx: obesity, asthma Medications: multivitamin, vitamin D, albutrol , nexplanon, epinephrine, biotin/collogin   Labs: vit D 12.08,  Notable signs/symptoms: none noted Any previous deficiencies? No Bowel Habits: Every day to every other day no complaints   Body Composition Scale 02/01/2023 04/25/2023  Current Body Weight 329.5 292.2  Total Body Fat % 49.2 46.7  Visceral Fat 16 14  Fat-Free Mass % 50.7 53.2   Total Body Water % 39.8 41.1  Muscle-Mass lbs 35.5 35.1  BMI 53.2 47.1  Body Fat Displacement           Torso  lbs 100.8 84.7         Left Leg  lbs 20.1 16.9         Right Leg  lbs 20.1 16.9         Left Arm  lbs 10.0 8.4         Right Arm  lbs 10.0 8.4   Lifestyle & Dietary Hx  Pt knows she needs knows she needs to eat frequently, stating it is difficult with her work schedule to get in snacks. Pt states she will eat a beef stick or cheese stick. Pt states she is not tolerating grains, stating for complex carbs she is doing fruits. Pt states she has not gone to support group, stating she may be struggling with body dysphoria. Pt states she does not see the weight loss.  Estimated daily fluid intake: 88 oz Estimated daily protein intake: 60-75 g Supplements: biotin, multivitamin, calcium Current average weekly physical activity: gym 3 days a week (30 minutes cardio/ 30 minutes resistance), rebound and lights weights 2 days a week at home other days (rebound is 20 minutes and light weights)  24-Hr Dietary Recall First Meal: 1 cup nonfat greek yogurt, 1/4 c frozen blueberries Snack: chomps beef stick or cheese stick  Second Meal: white chicken chili (1 c.) Snack: cucumber  or green beans  Third Meal: meat or fish, green beans, melba toast Snack: glass of milk sometimes or sf pudding Beverages: water, water with flavorings, un-sweet ice tea  Post-Op Goals/ Signs/ Symptoms Using straws: yes Drinking while eating: no Chewing/swallowing difficulties: no Changes in vision: no Changes to mood/headaches: no Hair loss/changes to skin/nails: no Difficulty focusing/concentrating: no Sweating: no Limb weakness: no Dizziness/lightheadedness: no Palpitations: no  Carbonated/caffeinated beverages: no N/V/D/C/Gas: loose, but states it is back to how it was before surgery Abdominal pain: no Dumping syndrome: no   NUTRITION DIAGNOSIS  Overweight/obesity ([place]-3.3) related to past poor dietary habits and physical inactivity as evidenced by completed bariatric surgery and following dietary guidelines for continued weight loss and healthy nutrition status.   NUTRITION INTERVENTION Nutrition counseling (C-1) and education (E-2) to facilitate bariatric surgery goals, including: Diet advancement to the standard prep plan The importance of consuming adequate calories as well as certain nutrients daily due to the body's need for essential vitamins, minerals, and fats The importance of daily physical activity and to reach a goal of at least 150 minutes of moderate to vigorous physical activity weekly (or as directed by their physician) due to benefits such as increased musculature and improved lab values The importance of intuitive eating specifically learning hunger-satiety cues and understanding  the importance of learning a new body: The importance of mindful eating to avoid grazing behaviors   Handouts Provided Include  Standard Prep Plan Advancement Guide  Learning Style & Readiness for Change Teaching method utilized: Visual & Auditory  Demonstrated degree of understanding via: Teach Back  Readiness Level: ready Barriers to learning/adherence to lifestyle change:  nothing identified  RD's Notes for Next Visit Assess adherence to pt chosen goals  MONITORING & EVALUATION Dietary intake, weekly physical activity, body weight.  Next Steps Patient is to follow-up in 3 months for 6 month post-op follow-up.

## 2023-05-11 ENCOUNTER — Encounter (INDEPENDENT_AMBULATORY_CARE_PROVIDER_SITE_OTHER): Payer: Self-pay

## 2023-05-30 ENCOUNTER — Ambulatory Visit (INDEPENDENT_AMBULATORY_CARE_PROVIDER_SITE_OTHER): Payer: BC Managed Care – PPO | Admitting: Nurse Practitioner

## 2023-05-30 ENCOUNTER — Encounter: Payer: Self-pay | Admitting: Nurse Practitioner

## 2023-05-30 VITALS — BP 122/84 | HR 59 | Temp 97.0°F | Ht 66.0 in | Wt 279.0 lb

## 2023-05-30 DIAGNOSIS — R7303 Prediabetes: Secondary | ICD-10-CM

## 2023-05-30 DIAGNOSIS — Z9884 Bariatric surgery status: Secondary | ICD-10-CM

## 2023-05-30 DIAGNOSIS — G43009 Migraine without aura, not intractable, without status migrainosus: Secondary | ICD-10-CM | POA: Diagnosis not present

## 2023-05-30 DIAGNOSIS — F419 Anxiety disorder, unspecified: Secondary | ICD-10-CM

## 2023-05-30 DIAGNOSIS — F32A Depression, unspecified: Secondary | ICD-10-CM

## 2023-05-30 DIAGNOSIS — E282 Polycystic ovarian syndrome: Secondary | ICD-10-CM

## 2023-05-30 DIAGNOSIS — Z136 Encounter for screening for cardiovascular disorders: Secondary | ICD-10-CM

## 2023-05-30 DIAGNOSIS — Z0000 Encounter for general adult medical examination without abnormal findings: Secondary | ICD-10-CM | POA: Diagnosis not present

## 2023-05-30 DIAGNOSIS — E559 Vitamin D deficiency, unspecified: Secondary | ICD-10-CM | POA: Diagnosis not present

## 2023-05-30 DIAGNOSIS — Z0001 Encounter for general adult medical examination with abnormal findings: Secondary | ICD-10-CM

## 2023-05-30 LAB — COMPREHENSIVE METABOLIC PANEL
ALT: 29 U/L (ref 0–35)
AST: 34 U/L (ref 0–37)
Albumin: 4.4 g/dL (ref 3.5–5.2)
Alkaline Phosphatase: 72 U/L (ref 39–117)
BUN: 14 mg/dL (ref 6–23)
CO2: 28 mEq/L (ref 19–32)
Calcium: 9.8 mg/dL (ref 8.4–10.5)
Chloride: 103 mEq/L (ref 96–112)
Creatinine, Ser: 0.81 mg/dL (ref 0.40–1.20)
GFR: 97.97 mL/min (ref >60.00)
Glucose, Bld: 96 mg/dL (ref 70–99)
Potassium: 4.5 mEq/L (ref 3.5–5.1)
Sodium: 140 mEq/L (ref 135–145)
Total Bilirubin: 0.4 mg/dL (ref 0.2–1.2)
Total Protein: 7.3 g/dL (ref 6.0–8.3)

## 2023-05-30 LAB — CBC WITH DIFFERENTIAL/PLATELET
Basophils Absolute: 0 10*3/uL (ref 0.0–0.1)
Basophils Relative: 0.3 % (ref 0.0–3.0)
Eosinophils Absolute: 0.2 10*3/uL (ref 0.0–0.7)
Eosinophils Relative: 3.4 % (ref 0.0–5.0)
HCT: 42.2 % (ref 36.0–46.0)
Hemoglobin: 13.9 g/dL (ref 12.0–15.0)
Lymphocytes Relative: 37 % (ref 12.0–46.0)
Lymphs Abs: 2.4 10*3/uL (ref 0.7–4.0)
MCHC: 32.8 g/dL (ref 30.0–36.0)
MCV: 90.9 fl (ref 78.0–100.0)
Monocytes Absolute: 0.7 10*3/uL (ref 0.1–1.0)
Monocytes Relative: 10.7 % (ref 3.0–12.0)
Neutro Abs: 3.2 10*3/uL (ref 1.4–7.7)
Neutrophils Relative %: 48.6 % (ref 43.0–77.0)
Platelets: 251 10*3/uL (ref 150.0–400.0)
RBC: 4.65 Mil/uL (ref 3.87–5.11)
RDW: 13.9 % (ref 11.5–15.5)
WBC: 6.6 10*3/uL (ref 4.0–10.5)

## 2023-05-30 LAB — LIPID PANEL
Cholesterol: 156 mg/dL (ref 0–200)
HDL: 41.8 mg/dL (ref >39.00)
LDL Cholesterol: 91 mg/dL (ref 0–99)
NonHDL: 114.68
Total CHOL/HDL Ratio: 4
Triglycerides: 118 mg/dL (ref 0.0–149.0)
VLDL: 23.6 mg/dL (ref 0.0–40.0)

## 2023-05-30 LAB — FOLATE: Folate: 9.2 ng/mL (ref >5.9)

## 2023-05-30 LAB — HEMOGLOBIN A1C: Hgb A1c MFr Bld: 5.7 % (ref 4.6–6.5)

## 2023-05-30 LAB — VITAMIN B12: Vitamin B-12: 609 pg/mL (ref 211–911)

## 2023-05-30 LAB — VITAMIN D 25 HYDROXY (VIT D DEFICIENCY, FRACTURES): VITD: 42.43 ng/mL (ref 30.00–100.00)

## 2023-05-30 MED ORDER — NURTEC 75 MG PO TBDP: 75.0000 mg | ORAL_TABLET | ORAL | 0 refills | Status: DC | PRN

## 2023-05-30 NOTE — Assessment & Plan Note (Signed)
She has lost 61 pounds since in surgery in 01/2023. Congratulated her on this. She states that she has been doing well. She went out of town and forgot her vitamins for 4 days, however otherwise has been taking them regularly. Will check CMP, CBC, iron panel, vitamin D, vitamin B12, and folate.

## 2023-05-30 NOTE — Progress Notes (Signed)
BP 122/84 (BP Location: Right Arm)   Pulse (!) 59   Temp (!) 97 F (36.1 C)   Ht 5\' 6"  (1.676 m)   Wt 279 lb (126.6 kg)   LMP 05/20/2023 (Exact Date)   SpO2 97%   BMI 45.03 kg/m    Subjective:    Patient ID: Ruth Evans, female    DOB: 24-Mar-1996, 27 y.o.   MRN: [id number]  CC: Chief Complaint  Patient presents with   Annual Exam    With fasting lab work    HPI: Ruth Evans is a 27 y.o. female presenting on 05/30/2023 for comprehensive medical examination. Current medical complaints include:none  She currently lives with: spouse Menopausal Symptoms: no  Depression and Anxiety Screen done today and results listed below:     05/30/2023    8:18 AM 05/30/2023    8:14 AM 11/29/2022    8:28 AM 11/23/2022    8:56 AM 10/21/2022    7:06 PM  Depression screen PHQ 2/9  Decreased Interest 1 0 0 0 1  Down, Depressed, Hopeless 0 0 0 0 1  PHQ - 2 Score 1 0 0 0 2  Altered sleeping 0 0 0  1  Tired, decreased energy 0 0 1  2  Change in appetite 0 0 0  0  Feeling bad or failure about yourself  0 0 0  0  Trouble concentrating 0 0 0  1  Moving slowly or fidgety/restless 0 0 0  0  Suicidal thoughts 0 0 0  0  PHQ-9 Score 1 0 1  6  Difficult doing work/chores Not difficult at all  Not difficult at all  Somewhat difficult      11/29/2022    8:28 AM 10/21/2022    7:06 PM  GAD 7 : Generalized Anxiety Score  Nervous, Anxious, on Edge 0 1  Control/stop worrying 0 1  Worry too much - different things 0 0  Trouble relaxing 0 0  Restless 0 0  Easily annoyed or irritable 0 0  Afraid - awful might happen 0 0  Total GAD 7 Score 0 2  Anxiety Difficulty Not difficult at all Somewhat difficult    The patient does not have a history of falls. I did not complete a risk assessment for falls. A plan of care for falls was not documented.   Past Medical History:  Past Medical History:  Diagnosis Date   Allergy    Anemia    Anxiety    Asthma Since birth   Depression    Endometriosis     GERD (gastroesophageal reflux disease) 01/2021   Headache    Migraine   History of kidney stones    Ovarian cyst    Pneumonia    PONV (postoperative nausea and vomiting)    PTSD (post-traumatic stress disorder)     Surgical History:  Past Surgical History:  Procedure Laterality Date   FOOT SURGERY Left 2011   bone shving   GASTRIC ROUX-EN-Y N/A 01/17/2023   Procedure: LAPAROSCOPIC ROUX-EN-Y GASTRIC BYPASS WITH UPPER ENDOSCOPY;  Surgeon: Gaynelle Adu, MD;  Location: [facility];  Service: General;  Laterality: N/A;   NASAL SEPTUM SURGERY     WISDOM TOOTH EXTRACTION      Medications:  Current Outpatient Medications on File Prior to Visit  Medication Sig   albuterol (VENTOLIN HFA) 108 (90 Base) MCG/ACT inhaler Inhale 2 puffs into the lungs every 6 (six) hours as needed for wheezing or shortness of breath.  bariatric multiple vitamin (ADVANCED MULTI EA) CHEW chewable tablet Chew by mouth daily. 3 Caps Daily   Biotin 5000 MCG CAPS daily.   Calcium Citrate-Vitamin D (CELEBRATE CALCIUM CITRATE) 500-12.5 MG-MCG CHEW Chew by mouth 3 (three) times daily.   diphenhydrAMINE (BENADRYL) 25 MG tablet Take 1 tablet (25 mg total) by mouth every 6 (six) hours as needed. (Patient taking differently: Take 25 mg by mouth every 6 (six) hours as needed for allergies.)   EPINEPHrine (EPIPEN 2-PAK) 0.3 mg/0.3 mL IJ SOAJ injection Inject 0.3 mg into the muscle as needed for anaphylaxis.   famotidine (PEPCID) 20 MG tablet Take 1 tablet (20 mg total) by mouth daily.   ondansetron (ZOFRAN-ODT) 4 MG disintegrating tablet Take 1 tablet (4 mg total) by mouth every 6 (six) hours as needed for nausea or vomiting. (Patient not taking: Reported on 05/30/2023)   No current facility-administered medications on file prior to visit.    Allergies:  Allergies  Allergen Reactions   Tolmetin Nausea And Vomiting   Codeine Nausea And Vomiting    Able to take Dilaudid.    Nsaids Nausea And Vomiting    Vomiting    Pecan Nut  (Diagnostic)     Walnuts, pecans, hazelnuts other nuts    Shrimp (Diagnostic) Hives    Social History:  Social History   Socioeconomic History   Marital status: Married    Spouse name: Not on file   Number of children: Not on file   Years of education: Not on file   Highest education level: Not on file  Occupational History   Not on file  Tobacco Use   Smoking status: Never   Smokeless tobacco: Never  Vaping Use   Vaping status: Never Used  Substance and Sexual Activity   Alcohol use: Not Currently    Alcohol/week: 1.0 standard drink of alcohol    Types: 1 Glasses of wine per week    Comment: Realistically 2x a month   Drug use: Not Currently    Types: Marijuana   Sexual activity: Yes    Birth control/protection: Condom, Implant    Comment: Implant is expired  Other Topics Concern   Not on file  Social History Narrative   Not on file   Social Determinants of Health   Financial Resource Strain: Not on file  Food Insecurity: No Food Insecurity (01/17/2023)   Hunger Vital Sign    Worried About Running Out of Food in the Last Year: Never true    Ran Out of Food in the Last Year: Never true  Transportation Needs: No Transportation Needs (01/17/2023)   PRAPARE - Administrator, Civil Service (Medical): No    Lack of Transportation (Non-Medical): No  Physical Activity: Not on file  Stress: Not on file  Social Connections: Not on file  Intimate Partner Violence: Not At Risk (01/17/2023)   Humiliation, Afraid, Rape, and Kick questionnaire    Fear of Current or Ex-Partner: No    Emotionally Abused: No    Physically Abused: No    Sexually Abused: No   Social History   Tobacco Use  Smoking Status Never  Smokeless Tobacco Never   Social History   Substance and Sexual Activity  Alcohol Use Not Currently   Alcohol/week: 1.0 standard drink of alcohol   Types: 1 Glasses of wine per week   Comment: Realistically 2x a month    Family History:  Family History   Problem Relation Age of Onset   Hypertension Mother  Macular degeneration Father    Asthma Father    Diabetes Maternal Aunt    Cancer Maternal Grandfather        THYROID AND SKIN   Breast cancer Paternal Grandmother    Cancer Paternal Grandmother    Hypertension Paternal Grandfather    Heart disease Paternal Grandfather    Asthma Paternal Grandfather     Past medical history, surgical history, medications, allergies, family history and social history reviewed with patient today and changes made to appropriate areas of the chart.   Review of Systems  Constitutional: Negative.   HENT: Negative.    Respiratory: Negative.    Cardiovascular: Negative.   Gastrointestinal:  Positive for nausea (at times). Negative for abdominal pain, constipation and diarrhea.  Genitourinary: Negative.   Musculoskeletal: Negative.   Skin: Negative.   Neurological:  Positive for headaches. Negative for dizziness.  Psychiatric/Behavioral: Negative.     All other ROS negative except what is listed above and in the HPI.      Objective:    BP 122/84 (BP Location: Right Arm)   Pulse (!) 59   Temp (!) 97 F (36.1 C)   Ht 5\' 6"  (1.676 m)   Wt 279 lb (126.6 kg)   LMP 05/20/2023 (Exact Date)   SpO2 97%   BMI 45.03 kg/m   Wt Readings from Last 3 Encounters:  05/30/23 279 lb (126.6 kg)  02/01/23 (!) 329 lb 8 oz (149.5 kg)  01/17/23 (!) 340 lb (154.2 kg)    Physical Exam Vitals and nursing note reviewed.  Constitutional:      General: She is not in acute distress.    Appearance: Normal appearance. She is obese.  HENT:     Head: Normocephalic and atraumatic.     Right Ear: Tympanic membrane, ear canal and external ear normal.     Left Ear: Tympanic membrane, ear canal and external ear normal.  Eyes:     Conjunctiva/sclera: Conjunctivae normal.  Cardiovascular:     Rate and Rhythm: Normal rate and regular rhythm.     Pulses: Normal pulses.     Heart sounds: Normal heart sounds.   Pulmonary:     Effort: Pulmonary effort is normal.     Breath sounds: Normal breath sounds.  Abdominal:     Palpations: Abdomen is soft.     Tenderness: There is no abdominal tenderness.  Musculoskeletal:        General: Normal range of motion.     Cervical back: Normal range of motion and neck supple.     Right lower leg: No edema.     Left lower leg: No edema.  Lymphadenopathy:     Cervical: No cervical adenopathy.  Skin:    General: Skin is warm and dry.  Neurological:     General: No focal deficit present.     Mental Status: She is alert and oriented to person, place, and time.     Cranial Nerves: No cranial nerve deficit.     Coordination: Coordination normal.     Gait: Gait normal.  Psychiatric:        Mood and Affect: Mood normal.        Behavior: Behavior normal.        Thought Content: Thought content normal.        Judgment: Judgment normal.     Results for orders placed or performed during the hospital encounter of 01/17/23  Glucose, capillary  Result Value Ref Range   Glucose-Capillary 93 70 -  99 mg/dL   Comment 1 Notify RN    Comment 2 Document in Chart   CBC  Result Value Ref Range   WBC 15.7 (H) 4.0 - 10.5 K/uL   RBC 4.63 3.87 - 5.11 MIL/uL   Hemoglobin 13.9 12.0 - 15.0 g/dL   HCT 10.2 72.5 - 36.6 %   MCV 91.4 80.0 - 100.0 fL   MCH 30.0 26.0 - 34.0 pg   MCHC 32.9 30.0 - 36.0 g/dL   RDW 44.0 34.7 - 42.5 %   Platelets 295 150 - 400 K/uL   nRBC 0.0 0.0 - 0.2 %  Creatinine, serum  Result Value Ref Range   Creatinine, Ser 0.85 0.44 - 1.00 mg/dL   GFR, Estimated >95 >63 mL/min  CBC with Differential  Result Value Ref Range   WBC 13.4 (H) 4.0 - 10.5 K/uL   RBC 4.06 3.87 - 5.11 MIL/uL   Hemoglobin 12.2 12.0 - 15.0 g/dL   HCT 87.5 64.3 - 32.9 %   MCV 91.6 80.0 - 100.0 fL   MCH 30.0 26.0 - 34.0 pg   MCHC 32.8 30.0 - 36.0 g/dL   RDW 51.8 84.1 - 66.0 %   Platelets 256 150 - 400 K/uL   nRBC 0.0 0.0 - 0.2 %   Neutrophils Relative % 77 %   Neutro Abs  10.2 (H) 1.7 - 7.7 K/uL   Lymphocytes Relative 13 %   Lymphs Abs 1.8 0.7 - 4.0 K/uL   Monocytes Relative 10 %   Monocytes Absolute 1.3 (H) 0.1 - 1.0 K/uL   Eosinophils Relative 0 %   Eosinophils Absolute 0.0 0.0 - 0.5 K/uL   Basophils Relative 0 %   Basophils Absolute 0.0 0.0 - 0.1 K/uL   Immature Granulocytes 0 %   Abs Immature Granulocytes 0.05 0.00 - 0.07 K/uL  Comprehensive metabolic panel  Result Value Ref Range   Sodium 140 135 - 145 mmol/L   Potassium 4.6 3.5 - 5.1 mmol/L   Chloride 105 98 - 111 mmol/L   CO2 26 22 - 32 mmol/L   Glucose, Bld 141 (H) 70 - 99 mg/dL   BUN 10 6 - 20 mg/dL   Creatinine, Ser 6.30 0.44 - 1.00 mg/dL   Calcium 9.0 8.9 - 16.0 mg/dL   Total Protein 7.1 6.5 - 8.1 g/dL   Albumin 3.9 3.5 - 5.0 g/dL   AST 24 15 - 41 U/L   ALT 23 0 - 44 U/L   Alkaline Phosphatase 38 38 - 126 U/L   Total Bilirubin 0.5 0.3 - 1.2 mg/dL   GFR, Estimated >10 >93 mL/min   Anion gap 9 5 - 15  CBC with Differential  Result Value Ref Range   WBC 11.4 (H) 4.0 - 10.5 K/uL   RBC 3.85 (L) 3.87 - 5.11 MIL/uL   Hemoglobin 11.5 (L) 12.0 - 15.0 g/dL   HCT 23.5 57.3 - 22.0 %   MCV 94.3 80.0 - 100.0 fL   MCH 29.9 26.0 - 34.0 pg   MCHC 31.7 30.0 - 36.0 g/dL   RDW 25.4 27.0 - 62.3 %   Platelets 232 150 - 400 K/uL   nRBC 0.0 0.0 - 0.2 %   Neutrophils Relative % 64 %   Neutro Abs 7.2 1.7 - 7.7 K/uL   Lymphocytes Relative 26 %   Lymphs Abs 3.0 0.7 - 4.0 K/uL   Monocytes Relative 9 %   Monocytes Absolute 1.1 (H) 0.1 - 1.0 K/uL   Eosinophils Relative 1 %  Eosinophils Absolute 0.1 0.0 - 0.5 K/uL   Basophils Relative 0 %   Basophils Absolute 0.0 0.0 - 0.1 K/uL   Immature Granulocytes 0 %   Abs Immature Granulocytes 0.03 0.00 - 0.07 K/uL  Pregnancy, urine POC  Result Value Ref Range   Preg Test, Ur NEGATIVE NEGATIVE  ABO/Rh  Result Value Ref Range   ABO/RH(D)      A NEG Performed at [facility], [address] [place], [place] [address]       Assessment  & Plan:   Problem List Items Addressed This Visit       Cardiovascular and Mediastinum   Migraine without aura and without status migrainosus, not intractable    Chronic, ongoing.  She was taking Excedrin before her surgery which would help with her migraines, however now she cannot take NSAIDs after her gastric bypass surgery.  She has been taking Tylenol, however does not feel this is effective.  She cannot take triptans with recent surgery.  Will have her start Nurtec 75 mg every other day as needed for migraine.      Relevant Medications   Rimegepant Sulfate (NURTEC) 75 MG TBDP     Endocrine   PCOS (polycystic ovarian syndrome)    She states that her menstrual periods are getting more regular since she has been losing weight, however they are slightly heavier.  She is going to get her Nexplanon removed next week at [facility].        Other   Vitamin D deficiency    Will check vitamin D levels and treat based on results.  Currently she is taking 3000 units daily in her bariatric vitamin.      Relevant Orders   VITAMIN D 25 Hydroxy (Vit-D Deficiency, Fractures)   Morbid obesity (HCC)    She is 4 months s/p bariatric surgery and doing well.  She has lost 61 pounds since then.      Anxiety and depression    Chronic, stable.  She feels like her mood is doing well and stable.  She denies SI/HI.  Follow-up with any concerns.      Prediabetes    Will check A1c today and treat based on results.  We did discuss metformin, however we will hold off if her sugars are back down to normal range      S/P gastric bypass    She has lost 61 pounds since in surgery in 01/2023. Congratulated her on this. She states that she has been doing well. She went out of town and forgot her vitamins for 4 days, however otherwise has been taking them regularly. Will check CMP, CBC, iron panel, vitamin D, vitamin B12, and folate.       Relevant Orders   Iron, TIBC and Ferritin Panel   Hemoglobin  A1c   VITAMIN D 25 Hydroxy (Vit-D Deficiency, Fractures)   Vitamin B12   Folate   Routine general medical examination at a health care facility - Primary    Health maintenance reviewed and updated. Discussed nutrition, exercise. Check CMP, CBC today. Follow-up 1 year.        Relevant Orders   CBC with Differential/Platelet   Comprehensive metabolic panel   Other Visit Diagnoses     Screening for cardiovascular condition       Relevant Orders   Lipid panel        Follow up plan: Return in about 6 months (around 11/30/2023) for follow-up.   LABORATORY TESTING:  -  Pap smear: up to date  IMMUNIZATIONS:   - Tdap: Tetanus vaccination status reviewed: last tetanus booster within 10 years. - Influenza: Postponed to flu season - Pneumovax: Not applicable - Prevnar: Not applicable - HPV: Up to date - Shingrix vaccine: Not applicable  SCREENING: -Mammogram: Not applicable  - Colonoscopy: Not applicable  - Bone Density: Not applicable   PATIENT COUNSELING:   Advised to take 1 mg of folate supplement per day if capable of pregnancy.   Sexuality: Discussed sexually transmitted diseases, partner selection, use of condoms, avoidance of unintended pregnancy  and contraceptive alternatives.   Advised to avoid cigarette smoking.  I discussed with the patient that most people either abstain from alcohol or drink within safe limits (<=14/week and <=4 drinks/occasion for males, <=7/weeks and <= 3 drinks/occasion for females) and that the risk for alcohol disorders and other health effects rises proportionally with the number of drinks per week and how often a drinker exceeds daily limits.  Discussed cessation/primary prevention of drug use and availability of treatment for abuse.   Diet: Encouraged to adjust caloric intake to maintain  or achieve ideal body weight, to reduce intake of dietary saturated fat and total fat, to limit sodium intake by avoiding high sodium foods and not  adding table salt, and to maintain adequate dietary potassium and calcium preferably from fresh fruits, vegetables, and low-fat dairy products.    stressed the importance of regular exercise  Injury prevention: Discussed safety belts, safety helmets, smoke detector, smoking near bedding or upholstery.   Dental health: Discussed importance of regular tooth brushing, flossing, and dental visits.    NEXT PREVENTATIVE PHYSICAL DUE IN 1 YEAR. Return in about 6 months (around 11/30/2023) for follow-up.  Jesstin Studstill A Teauna Dubach

## 2023-05-30 NOTE — Assessment & Plan Note (Signed)
Health maintenance reviewed and updated. Discussed nutrition, exercise. Check CMP, CBC today. Follow-up 1 year.   

## 2023-05-30 NOTE — Assessment & Plan Note (Signed)
Will check A1c today and treat based on results.  We did discuss metformin, however we will hold off if her sugars are back down to normal range

## 2023-05-30 NOTE — Patient Instructions (Signed)
It was great to see you!  We are checking your labs today and will let you know the results via mychart/phone.   Start nurtec every other day as needed for migraine. You can also take tylenol.   Let's follow-up in 6 months, sooner if you have concerns.  If a referral was placed today, you will be contacted for an appointment. Please note that routine referrals can sometimes take up to 3-4 weeks to process. Please call our office if you haven't heard anything after this time frame.  Take care,  Rodman Pickle, NP

## 2023-05-30 NOTE — Assessment & Plan Note (Signed)
Chronic, ongoing.  She was taking Excedrin before her surgery which would help with her migraines, however now she cannot take NSAIDs after her gastric bypass surgery.  She has been taking Tylenol, however does not feel this is effective.  She cannot take triptans with recent surgery.  Will have her start Nurtec 75 mg every other day as needed for migraine.

## 2023-05-30 NOTE — Assessment & Plan Note (Signed)
She is 4 months s/p bariatric surgery and doing well.  She has lost 61 pounds since then.

## 2023-05-30 NOTE — Assessment & Plan Note (Signed)
She states that her menstrual periods are getting more regular since she has been losing weight, however they are slightly heavier.  She is going to get her Nexplanon removed next week at [facility].

## 2023-05-30 NOTE — Assessment & Plan Note (Signed)
Chronic, stable.  She feels like her mood is doing well and stable.  She denies SI/HI.  Follow-up with any concerns.

## 2023-05-30 NOTE — Assessment & Plan Note (Addendum)
Will check vitamin D levels and treat based on results.  Currently she is taking 3000 units daily in her bariatric vitamin.

## 2023-05-31 LAB — IRON,TIBC AND FERRITIN PANEL
%SAT: 23 % (ref 16–45)
Ferritin: 24 ng/mL (ref 16–154)
Iron: 74 ug/dL (ref 40–190)
TIBC: 316 ug/dL (ref 250–450)

## 2023-06-02 ENCOUNTER — Telehealth: Payer: Self-pay

## 2023-06-02 ENCOUNTER — Other Ambulatory Visit (HOSPITAL_COMMUNITY): Payer: Self-pay

## 2023-06-02 NOTE — Telephone Encounter (Signed)
Nurtec 75mg     Plan does not cover medication. Please call plan at [phone] to initate a prior auth.  Patient ID# [id number]

## 2023-06-02 NOTE — Telephone Encounter (Signed)
Pharmacy Patient Advocate Encounter   Received notification from Pt Calls Messages that prior authorization for Nurtec ODT 75mg  is required/requested.   Insurance verification completed.   The patient is insured through [facility] .   Per test claim: PA required; PA started via CoverMyMeds. KEY [id number] . Waiting for clinical questions to populate.

## 2023-06-03 NOTE — Telephone Encounter (Signed)
 Clinical questions answered and PA submitted

## 2023-06-06 MED ORDER — UBRELVY 100 MG PO TABS: 100.0000 mg | ORAL_TABLET | Freq: Every day | ORAL | 1 refills | Status: AC | PRN

## 2023-06-06 NOTE — Telephone Encounter (Signed)
LVM to return call.

## 2023-06-06 NOTE — Telephone Encounter (Signed)
Incoming Call [place] w/[employer] [place] Ph # [phone] option 3, then option 1 Pt ID [id number]   Nurtec 75mg  denied Medication is nonformulary. Member must meet all crieteria for episdic and acute migraines to exceed plan limit of #8 tabs per month. Pt must also try and fail Ubrelvy.  She will be faxing letter as well.

## 2023-06-07 ENCOUNTER — Other Ambulatory Visit (HOSPITAL_COMMUNITY): Payer: Self-pay

## 2023-06-07 ENCOUNTER — Telehealth: Payer: Self-pay

## 2023-06-07 NOTE — Telephone Encounter (Signed)
LVM to return call.

## 2023-06-07 NOTE — Telephone Encounter (Signed)
Pharmacy Patient Advocate Encounter   Received notification from Pt Calls Messages that prior authorization for Ubrelvy 100mg  is required/requested.   Insurance verification completed.   The patient is insured through [facility] .   Per test claim: PA required; PA submitted to [facility] via CoverMyMeds Key/confirmation #/EOC [id number] Status is pending

## 2023-06-07 NOTE — Telephone Encounter (Signed)
Pharmacy Patient Advocate Encounter  Received notification from [facility] that Prior Authorization for Bernita Raisin has been APPROVED from 06/07/23 to 08/30/23   PA #/Case ID/Reference #:   [id number]

## 2023-06-08 NOTE — Telephone Encounter (Signed)
LVM to return call.

## 2023-06-09 NOTE — Telephone Encounter (Signed)
LVM for patient to return call. I will send patient a MyCHart message to return call.

## 2023-06-09 NOTE — Telephone Encounter (Signed)
LVM for patient to return call and sent patient a Mychart message.

## 2023-06-10 NOTE — Telephone Encounter (Addendum)
LVM for patient to return call.  I will mail patient a letter.     I did call [facility] to check if patient had picked up Rx of Ubrelvy and she has not

## 2023-06-10 NOTE — Telephone Encounter (Signed)
LVM for patient to return call.  I will mail patient a letter.     I did call [facility] to check if patient had picked up Rx of Ubrelvy and she has not

## 2023-06-14 NOTE — Telephone Encounter (Signed)
LVM to return call.

## 2023-06-14 NOTE — Telephone Encounter (Signed)
LVM for patient to return call. 

## 2023-06-16 NOTE — Telephone Encounter (Signed)
LVM to return call.

## 2023-06-17 NOTE — Telephone Encounter (Signed)
Patient has read her Mychart message at  4:36 PM on 06/09/2023 and is aware.

## 2023-07-26 ENCOUNTER — Ambulatory Visit: Payer: BC Managed Care – PPO | Admitting: Dietician

## 2023-07-27 DIAGNOSIS — Z6841 Body Mass Index (BMI) 40.0 and over, adult: Secondary | ICD-10-CM | POA: Diagnosis not present

## 2023-07-27 DIAGNOSIS — J4521 Mild intermittent asthma with (acute) exacerbation: Secondary | ICD-10-CM | POA: Diagnosis not present

## 2023-07-27 DIAGNOSIS — H66002 Acute suppurative otitis media without spontaneous rupture of ear drum, left ear: Secondary | ICD-10-CM | POA: Diagnosis not present

## 2023-08-29 DIAGNOSIS — Z3046 Encounter for surveillance of implantable subdermal contraceptive: Secondary | ICD-10-CM | POA: Diagnosis not present

## 2023-09-20 ENCOUNTER — Telehealth: Payer: Self-pay | Admitting: Pharmacist

## 2023-09-20 NOTE — Telephone Encounter (Signed)
Pharmacy Patient Advocate Encounter   Received notification from CoverMyMeds that prior authorization for Ubrelvy 100MG  tablets is required/requested.   Insurance verification completed.   The patient is insured through [facility] .   Per test claim: PA required; PA submitted to above mentioned insurance via CoverMyMeds Key/confirmation #/EOC [id number] Status is pending

## 2023-09-20 NOTE — Telephone Encounter (Signed)
Pharmacy Patient Advocate Encounter  Received notification from [facility] that Prior Authorization for Ubrelvy 100mg  tablets has been APPROVED from 09/20/2023 to 09/19/2024   PA #/Case ID/Reference #: [id number]

## 2023-12-05 ENCOUNTER — Ambulatory Visit: Payer: BC Managed Care – PPO | Admitting: Nurse Practitioner

## 2024-02-24 DIAGNOSIS — Z6833 Body mass index (BMI) 33.0-33.9, adult: Secondary | ICD-10-CM | POA: Diagnosis not present

## 2024-02-24 DIAGNOSIS — R051 Acute cough: Secondary | ICD-10-CM | POA: Diagnosis not present

## 2024-02-24 DIAGNOSIS — J0100 Acute maxillary sinusitis, unspecified: Secondary | ICD-10-CM | POA: Diagnosis not present

## 2024-03-09 DIAGNOSIS — R051 Acute cough: Secondary | ICD-10-CM | POA: Diagnosis not present

## 2024-03-09 DIAGNOSIS — Z6832 Body mass index (BMI) 32.0-32.9, adult: Secondary | ICD-10-CM | POA: Diagnosis not present

## 2024-03-09 DIAGNOSIS — J0100 Acute maxillary sinusitis, unspecified: Secondary | ICD-10-CM | POA: Diagnosis not present

## 2024-08-17 ENCOUNTER — Encounter (HOSPITAL_COMMUNITY): Payer: Self-pay | Admitting: *Deleted

## 2024-09-08 DIAGNOSIS — R0902 Hypoxemia: Secondary | ICD-10-CM | POA: Diagnosis not present

## 2024-09-08 DIAGNOSIS — G4089 Other seizures: Secondary | ICD-10-CM | POA: Diagnosis not present

## 2024-09-08 DIAGNOSIS — R404 Transient alteration of awareness: Secondary | ICD-10-CM | POA: Diagnosis not present

## 2024-09-08 DIAGNOSIS — R569 Unspecified convulsions: Secondary | ICD-10-CM | POA: Diagnosis not present

## 2024-09-08 DIAGNOSIS — G40901 Epilepsy, unspecified, not intractable, with status epilepticus: Secondary | ICD-10-CM | POA: Diagnosis not present

## 2024-09-08 DIAGNOSIS — R400 Somnolence: Secondary | ICD-10-CM | POA: Diagnosis not present

## 2024-09-08 DIAGNOSIS — R519 Headache, unspecified: Secondary | ICD-10-CM | POA: Diagnosis not present

## 2024-09-20 ENCOUNTER — Other Ambulatory Visit (HOSPITAL_COMMUNITY): Payer: Self-pay
